# Patient Record
Sex: Male | Born: 1945
Health system: Southern US, Community
[De-identification: ages and names within clinical notes are randomized; demographics above are authoritative.]

## PROBLEM LIST (undated history)

## (undated) DIAGNOSIS — C61 Malignant neoplasm of prostate: Secondary | ICD-10-CM

## (undated) HISTORY — PX: PROSTATE BIOPSY: SHX241

## (undated) HISTORY — PX: WRIST FRACTURE SURGERY: SHX121

## (undated) HISTORY — DX: Malignant neoplasm of prostate: C61

## (undated) HISTORY — PX: INNER EAR SURGERY: SHX679

## (undated) HISTORY — PX: HEMORROIDECTOMY: SUR656

---

## 2013-05-21 ENCOUNTER — Encounter (HOSPITAL_COMMUNITY): Payer: Self-pay | Admitting: Emergency Medicine

## 2013-05-21 ENCOUNTER — Emergency Department (HOSPITAL_COMMUNITY)
Admission: EM | Admit: 2013-05-21 | Discharge: 2013-05-21 | Disposition: A | Discharge status: Home / Self Care | Payer: Medicare HMO | Attending: Emergency Medicine | Admitting: Emergency Medicine

## 2013-05-21 DIAGNOSIS — H9209 Otalgia, unspecified ear: Secondary | ICD-10-CM | POA: Insufficient documentation

## 2013-05-21 DIAGNOSIS — Z87891 Personal history of nicotine dependence: Secondary | ICD-10-CM | POA: Insufficient documentation

## 2013-05-21 DIAGNOSIS — J029 Acute pharyngitis, unspecified: Secondary | ICD-10-CM

## 2013-05-21 DIAGNOSIS — Z9889 Other specified postprocedural states: Secondary | ICD-10-CM | POA: Insufficient documentation

## 2013-05-21 MED ORDER — HYDROCODONE-ACETAMINOPHEN 5-325 MG PO TABS: 1.0000 | ORAL_TABLET | ORAL | Status: DC | PRN

## 2013-05-21 MED ORDER — HYDROCODONE-ACETAMINOPHEN 5-325 MG PO TABS
1.0000 | ORAL_TABLET | Freq: Once | ORAL | Status: AC
  Administered 2013-05-21: 1 via ORAL
  Filled 2013-05-21: qty 1

## 2013-05-21 MED ORDER — AMOXICILLIN 500 MG PO CAPS: 500.0000 mg | ORAL_CAPSULE | Freq: Three times a day (TID) | ORAL | Status: DC

## 2013-05-21 MED ORDER — PENICILLIN V POTASSIUM 250 MG PO TABS
500.0000 mg | ORAL_TABLET | Freq: Once | ORAL | Status: AC
  Administered 2013-05-21: 500 mg via ORAL
  Filled 2013-05-21: qty 2

## 2013-05-21 MED ORDER — IBUPROFEN 400 MG PO TABS
400.0000 mg | ORAL_TABLET | Freq: Once | ORAL | Status: AC
  Administered 2013-05-21: 400 mg via ORAL
  Filled 2013-05-21: qty 1

## 2013-05-21 NOTE — ED Notes (Signed)
Patient c/o left ear pain and sore throat x3 days. Denies any drainage. Per patient had operation on left ear in past.

## 2013-05-21 NOTE — ED Provider Notes (Signed)
CSN: [id number]     Arrival date & time 05/21/13  1842 History   First MD Initiated Contact with Patient 05/21/13 1942     Chief Complaint  Patient presents with  . Otalgia  . Sore Throat     (Consider location/radiation/quality/duration/timing/severity/associated sxs/prior Treatment) Patient is a 68 y.o. male presenting with pharyngitis. The history is provided by the patient.  Sore Throat This is a new problem. The current episode started in the past 7 days. The problem occurs constantly. The problem has been gradually worsening. Associated symptoms include arthralgias, fatigue, myalgias and a sore throat. Pertinent negatives include no abdominal pain, chest pain, coughing, neck pain, rash or vomiting. Associated symptoms comments: Left ear pain. The symptoms are aggravated by swallowing. He has tried acetaminophen for the symptoms. The treatment provided no relief.    History reviewed. No pertinent past medical history. Past Surgical History  Procedure Laterality Date  . Inner ear surgery    . Hemorroidectomy    . Wrist fracture surgery Left    History reviewed. No pertinent family history. History  Substance Use Topics  . Smoking status: Former Smoker -- 0.03 packs/day for 3 years    Types: Cigarettes  . Smokeless tobacco: Never Used  . Alcohol Use: No    Review of Systems  Constitutional: Positive for fatigue. Negative for activity change.       All ROS Neg except as noted in HPI  HENT: Positive for ear pain and sore throat. Negative for nosebleeds.   Eyes: Negative for photophobia and discharge.  Respiratory: Negative for cough, shortness of breath and wheezing.   Cardiovascular: Negative for chest pain and palpitations.  Gastrointestinal: Negative for vomiting, abdominal pain and blood in stool.  Genitourinary: Negative for dysuria, frequency and hematuria.  Musculoskeletal: Positive for arthralgias and myalgias. Negative for back pain and neck pain.  Skin: Negative.   Negative for rash.  Neurological: Negative for dizziness, seizures and speech difficulty.  Psychiatric/Behavioral: Negative for hallucinations and confusion.      Allergies  Review of patient's allergies indicates no known allergies.  Home Medications   Prior to Admission medications   Not on File   BP 178/94  Pulse 95  Temp(Src) 98.3 F (36.8 C) (Oral)  Resp 22  Ht 5\' 7"  (1.702 m)  Wt 178 lb (80.74 kg)  BMI 27.87 kg/m2  SpO2 99% Physical Exam  Nursing note and vitals reviewed. Constitutional: He is oriented to person, place, and time. He appears well-developed and well-nourished.  Non-toxic appearance.  HENT:  Head: Normocephalic.  Right Ear: Tympanic membrane and external ear normal.  Left Ear: Tympanic membrane and external ear normal.  Mouth/Throat: Posterior oropharyngeal erythema present.  Eyes: EOM and lids are normal. Pupils are equal, round, and reactive to light.  Neck: Normal range of motion. Neck supple. Carotid bruit is not present.  Cardiovascular: Normal rate, regular rhythm, normal heart sounds, intact distal pulses and normal pulses.   Pulmonary/Chest: Breath sounds normal. No respiratory distress.  Abdominal: Soft. Bowel sounds are normal. There is no tenderness. There is no guarding.  Musculoskeletal: Normal range of motion.  Lymphadenopathy:       Head (right side): No submandibular adenopathy present.       Head (left side): No submandibular adenopathy present.    He has no cervical adenopathy.  Neurological: He is alert and oriented to person, place, and time. He has normal strength. No cranial nerve deficit or sensory deficit.  Skin: Skin is warm and dry.  Psychiatric: He has a normal mood and affect. His speech is normal.    ED Course  Procedures (including critical care time) Labs Review Labs Reviewed - No data to display  Imaging Review No results found.   EKG Interpretation None      MDM Patient presents to the emergency  department with 3 days of sore throat and left ear pain. There is increased redness involving the posterior pharynx. There is pain with swallowing, or body aches is present also. The patient will be treated with Amoxil 3 times daily, and Norco every 6 hours for pain. The patient is invited to use salt water gargles. He is also invited to return to the emergency department if any changes, problems, or concerns.    Final diagnoses:  None    **I have reviewed nursing notes, vital signs, and all appropriate lab and imaging results for this patient.Lenox Ahr, PA-C 05/22/13 2141

## 2013-05-21 NOTE — Discharge Instructions (Signed)
Pharyngitis °Pharyngitis is redness, pain, and swelling (inflammation) of your pharynx.  °CAUSES  °Pharyngitis is usually caused by infection. Most of the time, these infections are from viruses (viral) and are part of a cold. However, sometimes pharyngitis is caused by bacteria (bacterial). Pharyngitis can also be caused by allergies. Viral pharyngitis may be spread from person to person by coughing, sneezing, and personal items or utensils (cups, forks, spoons, toothbrushes). Bacterial pharyngitis may be spread from person to person by more intimate contact, such as kissing.  °SIGNS AND SYMPTOMS  °Symptoms of pharyngitis include:   °· Sore throat.   °· Tiredness (fatigue).   °· Low-grade fever.   °· Headache. °· Joint pain and muscle aches. °· Skin rashes. °· Swollen lymph nodes. °· Plaque-like film on throat or tonsils (often seen with bacterial pharyngitis). °DIAGNOSIS  °Your health care provider will ask you questions about your illness and your symptoms. Your medical history, along with a physical exam, is often all that is needed to diagnose pharyngitis. Sometimes, a rapid strep test is done. Other lab tests may also be done, depending on the suspected cause.  °TREATMENT  °Viral pharyngitis will usually get better in 3 4 days without the use of medicine. Bacterial pharyngitis is treated with medicines that kill germs (antibiotics).  °HOME CARE INSTRUCTIONS  °· Drink enough water and fluids to keep your urine clear or pale yellow.   °· Only take over-the-counter or prescription medicines as directed by your health care provider:   °· If you are prescribed antibiotics, make sure you finish them even if you start to feel better.   °· Do not take aspirin.   °· Get lots of rest.   °· Gargle with 8 oz of salt water (½ tsp of salt per 1 qt of water) as often as every 1 2 hours to soothe your throat.   °· Throat lozenges (if you are not at risk for choking) or sprays may be used to soothe your throat. °SEEK MEDICAL  CARE IF:  °· You have large, tender lumps in your neck. °· You have a rash. °· You cough up green, yellow-brown, or bloody spit. °SEEK IMMEDIATE MEDICAL CARE IF:  °· Your neck becomes stiff. °· You drool or are unable to swallow liquids. °· You vomit or are unable to keep medicines or liquids down. °· You have severe pain that does not go away with the use of recommended medicines. °· You have trouble breathing (not caused by a stuffy nose). °MAKE SURE YOU:  °· Understand these instructions. °· Will watch your condition. °· Will get help right away if you are not doing well or get worse. °Document Released: 01/14/2005 Document Revised: 11/04/2012 Document Reviewed: 09/21/2012 °[employer]® Patient Information ©2014 [employer]. ° °

## 2013-05-26 NOTE — ED Provider Notes (Signed)
Medical screening examination/treatment/procedure(s) were performed by non-physician practitioner and as supervising physician I was immediately available for consultation/collaboration.   EKG Interpretation None      Rolland Porter, MD, Abram Sander   Janice Norrie, MD 05/26/13 [phone]

## 2013-07-19 ENCOUNTER — Telehealth: Payer: Self-pay | Admitting: Family Medicine

## 2013-07-19 NOTE — Telephone Encounter (Signed)
Pt aware that we are currently not taking new chronic patients- they were advised to call back the first of July- that we have some new MD's coming.

## 2013-07-27 ENCOUNTER — Emergency Department (HOSPITAL_COMMUNITY)
Admission: EM | Admit: 2013-07-27 | Discharge: 2013-07-27 | Disposition: A | Discharge status: Home / Self Care | Payer: Medicare HMO | Attending: Emergency Medicine | Admitting: Emergency Medicine

## 2013-07-27 ENCOUNTER — Encounter (HOSPITAL_COMMUNITY): Payer: Self-pay | Admitting: Emergency Medicine

## 2013-07-27 DIAGNOSIS — R51 Headache: Secondary | ICD-10-CM | POA: Insufficient documentation

## 2013-07-27 DIAGNOSIS — Z792 Long term (current) use of antibiotics: Secondary | ICD-10-CM | POA: Insufficient documentation

## 2013-07-27 DIAGNOSIS — H9319 Tinnitus, unspecified ear: Secondary | ICD-10-CM | POA: Insufficient documentation

## 2013-07-27 DIAGNOSIS — R42 Dizziness and giddiness: Secondary | ICD-10-CM | POA: Insufficient documentation

## 2013-07-27 DIAGNOSIS — Z87891 Personal history of nicotine dependence: Secondary | ICD-10-CM | POA: Insufficient documentation

## 2013-07-27 DIAGNOSIS — Z9889 Other specified postprocedural states: Secondary | ICD-10-CM | POA: Insufficient documentation

## 2013-07-27 NOTE — ED Notes (Signed)
Pt c/o of dizziness over last 2 months, progressively worsening. Hx of leg ear surgery around 20 years ago, unsure of type. Admits to intermittent ringing in left ear.

## 2013-07-27 NOTE — Discharge Instructions (Signed)

## 2013-07-27 NOTE — ED Provider Notes (Signed)
CSN: [id number]     Arrival date & time 07/27/13  1497 History  This chart was scribed for Sharyon Cable, MD by Cathie Hoops, ED Scribe. The patient was seen in [place]. The patient's care was started at 9:44 AM.   Chief Complaint  Patient presents with  . Dizziness   Patient is a 68 y.o. male presenting with dizziness. The history is provided by the patient. No language interpreter was used.  Dizziness Quality:  Room spinning Severity:  Moderate Onset quality:  Gradual Duration:  2 months Timing:  Constant Progression:  Waxing and waning Chronicity:  Recurrent Relieved by:  Nothing Worsened by:  Nothing tried Associated symptoms: headaches and tinnitus (left ear)   Associated symptoms: no chest pain, no shortness of breath, no vomiting and no weakness   Risk factors: no hx of stroke    HPI Comments: Frankey Botting is a 68 y.o. male who presents to the Emergency Department complaining of constant, waxing and waning dizziness for 2 months. Pt states he experiences a room spinning sensation. Pt states he is currently not in pain and is feeling better today. He states he experiences associated intermittent tinnitus in his left ear, left ear pain, and headache. Pt last saw the ED last week and has gone to the ED about 4-5 times in 2 months. Pt states he had previous surgery in the left ear about 10 years ago.  He denies vision changes, fever, vomiting, SOB, chest pain, difficulty speaking, weakness in the arms or the legs, numbness, slurred speech. Pt denies any history of CVA.  He reports he has not had any recent imaging  PMH - none Past Surgical History  Procedure Laterality Date  . Inner ear surgery    . Hemorroidectomy    . Wrist fracture surgery Left    History reviewed. No pertinent family history. History  Substance Use Topics  . Smoking status: Former Smoker -- 0.03 packs/day for 3 years    Types: Cigarettes  . Smokeless tobacco: Never Used  . Alcohol Use: No     Review of Systems  Constitutional: Negative for fever and chills.  HENT: Positive for ear pain (left ) and tinnitus (left ear).   Eyes: Negative for visual disturbance.  Respiratory: Negative for shortness of breath.   Cardiovascular: Negative for chest pain.  Gastrointestinal: Negative for vomiting.  Neurological: Positive for dizziness and headaches. Negative for speech difficulty, weakness and numbness.  All other systems reviewed and are negative.   Allergies  Review of patient's allergies indicates no known allergies.  Home Medications   Prior to Admission medications   Medication Sig Start Date End Date Taking? Authorizing Provider  amoxicillin (AMOXIL) 500 MG capsule Take 1 capsule (500 mg total) by mouth 3 (three) times daily. 05/21/13   Lenox Ahr, PA-C  HYDROcodone-acetaminophen (NORCO/VICODIN) 5-325 MG per tablet Take 1 tablet by mouth every 4 (four) hours as needed for moderate pain. 05/21/13   Lenox Ahr, PA-C   Triage Vitals: Pulse 83  Ht 5\' 7"  (1.702 m)  Wt 165 lb (74.844 kg)  BMI 25.84 kg/m2  SpO2 98%  Physical Exam  Nursing note and vitals reviewed.  CONSTITUTIONAL: Well developed/well nourished HEAD: Normocephalic/atraumatic EYES: EOMI/PERRL, no nystagmus,no ptosis ENMT: Mucous membranes moist, bilateral TMs intact.  Left TM mild erythema. NECK: supple no meningeal signs, no bruits CV: S1/S2 noted, no murmurs/rubs/gallops noted LUNGS: Lungs are clear to auscultation bilaterally, no apparent distress ABDOMEN: soft, nontender, no rebound or guarding GU:no  cva tenderness NEURO:Awake/alert, facies symmetric, no arm or leg drift is noted Equal 5/5 strength with hip flexion,knee flex/extension, foot dorsi/plantar flexion Cranial nerves 3/4/5/6/08/05/08/11/12 tested and intact Gait normal without ataxia No past pointing EXTREMITIES: pulses normal, full ROM SKIN: warm, color normal PSYCH: no abnormalities of mood noted   ED Course   Procedures DIAGNOSTIC STUDIES: Oxygen Saturation is 98% on RA, normal by my interpretation.    COORDINATION OF CARE: 9:48 AM- Discussed options for a referral to ENT. Patient informed of current plan for treatment and evaluation and agrees with plan at this time.  Pt reports he is actually feeling improved He has no focal neuro deficits Low suspicion for CVA He reports he would like specialist referral We discussed strict return precautions  MDM   Final diagnoses:  Dizziness    Nursing notes including past medical history and social history reviewed and considered in documentation   I personally performed the services described in this documentation, which was scribed in my presence. The recorded information has been reviewed and is accurate.      Sharyon Cable, MD 07/27/13 1040

## 2015-08-19 ENCOUNTER — Emergency Department (HOSPITAL_COMMUNITY)
Admission: EM | Admit: 2015-08-19 | Discharge: 2015-08-19 | Disposition: A | Discharge status: Home / Self Care | Payer: No Typology Code available for payment source | Attending: Emergency Medicine | Admitting: Emergency Medicine

## 2015-08-19 ENCOUNTER — Encounter (HOSPITAL_COMMUNITY): Payer: Self-pay | Admitting: Emergency Medicine

## 2015-08-19 ENCOUNTER — Emergency Department (HOSPITAL_COMMUNITY): Payer: No Typology Code available for payment source

## 2015-08-19 DIAGNOSIS — A0100 Typhoid fever, unspecified: Secondary | ICD-10-CM | POA: Diagnosis not present

## 2015-08-19 DIAGNOSIS — Z87891 Personal history of nicotine dependence: Secondary | ICD-10-CM | POA: Insufficient documentation

## 2015-08-19 DIAGNOSIS — Y92410 Unspecified street and highway as the place of occurrence of the external cause: Secondary | ICD-10-CM | POA: Diagnosis not present

## 2015-08-19 DIAGNOSIS — Y999 Unspecified external cause status: Secondary | ICD-10-CM | POA: Insufficient documentation

## 2015-08-19 DIAGNOSIS — S161XXA Strain of muscle, fascia and tendon at neck level, initial encounter: Secondary | ICD-10-CM | POA: Diagnosis not present

## 2015-08-19 DIAGNOSIS — Y939 Activity, unspecified: Secondary | ICD-10-CM | POA: Diagnosis not present

## 2015-08-19 DIAGNOSIS — M542 Cervicalgia: Secondary | ICD-10-CM | POA: Diagnosis not present

## 2015-08-19 MED ORDER — IBUPROFEN 600 MG PO TABS: 600.0000 mg | ORAL_TABLET | Freq: Four times a day (QID) | ORAL | Status: DC | PRN

## 2015-08-19 MED ORDER — ACETAMINOPHEN 325 MG PO TABS
650.0000 mg | ORAL_TABLET | Freq: Once | ORAL | Status: AC
  Administered 2015-08-19: 650 mg via ORAL
  Filled 2015-08-19: qty 2

## 2015-08-19 NOTE — ED Notes (Signed)
Pt made aware to return if symptoms worsen or if any life threatening symptoms occur.   

## 2015-08-19 NOTE — Discharge Instructions (Signed)
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
## 2015-09-18 ENCOUNTER — Telehealth: Payer: Self-pay | Admitting: Orthopaedic Surgery
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
## 2015-09-20 ENCOUNTER — Encounter: Payer: Self-pay | Admitting: Orthopaedic Surgery

## 2015-09-20 ENCOUNTER — Ambulatory Visit (INDEPENDENT_AMBULATORY_CARE_PROVIDER_SITE_OTHER): Payer: PPO | Admitting: Orthopaedic Surgery

## 2015-09-20 VITALS — BP 138/76 | HR 71 | Ht 67.0 in | Wt 155.0 lb

## 2015-09-20 DIAGNOSIS — M542 Cervicalgia: Secondary | ICD-10-CM | POA: Diagnosis not present

## 2015-09-20 MED ORDER — NAPROXEN 500 MG PO TABS: 500.0000 mg | ORAL_TABLET | Freq: Two times a day (BID) | ORAL | 5 refills | Status: DC
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
## 2015-09-27 ENCOUNTER — Ambulatory Visit (HOSPITAL_COMMUNITY): Payer: PPO | Attending: Orthopaedic Surgery | Admitting: Physical Therapy

## 2015-09-27 DIAGNOSIS — R29898 Other symptoms and signs involving the musculoskeletal system: Secondary | ICD-10-CM | POA: Insufficient documentation

## 2015-09-27 DIAGNOSIS — M542 Cervicalgia: Secondary | ICD-10-CM | POA: Diagnosis not present
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
## 2015-09-28 ENCOUNTER — Encounter: Payer: Self-pay | Admitting: Orthopaedic Surgery

## 2015-10-04 ENCOUNTER — Ambulatory Visit: Payer: PPO | Admitting: Orthopaedic Surgery

## 2015-10-04 ENCOUNTER — Ambulatory Visit (HOSPITAL_COMMUNITY): Payer: No Typology Code available for payment source | Attending: Orthopaedic Surgery | Admitting: Physical Therapy

## 2015-10-04 DIAGNOSIS — R29898 Other symptoms and signs involving the musculoskeletal system: Secondary | ICD-10-CM

## 2015-10-04 DIAGNOSIS — M542 Cervicalgia: Secondary | ICD-10-CM | POA: Insufficient documentation
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
## 2015-10-04 NOTE — Patient Instructions (Signed)
Isometric Lateral Flexion    Put right index finger on right temple. Gently try to move right ear toward shoulder, pushing against finger. Hold __5__ seconds. Repeat on other side. Push and release slowly. Repeat __10__ times. Do _2___ sessions per day.     Isometric Extension    Put index fingers gently on back of head. Slowly try to look toward ceiling. Push head into fingers for _5___ seconds. Push and release slowly. Repeat __10__ times. Do _2___ sessions per day.

## 2015-10-09 ENCOUNTER — Ambulatory Visit (HOSPITAL_COMMUNITY): Payer: No Typology Code available for payment source

## 2015-10-09 DIAGNOSIS — M542 Cervicalgia: Secondary | ICD-10-CM

## 2015-10-09 DIAGNOSIS — R29898 Other symptoms and signs involving the musculoskeletal system: Secondary | ICD-10-CM
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
## 2015-10-11 ENCOUNTER — Ambulatory Visit (INDEPENDENT_AMBULATORY_CARE_PROVIDER_SITE_OTHER): Payer: PPO | Admitting: Orthopaedic Surgery

## 2015-10-11 ENCOUNTER — Encounter: Payer: Self-pay | Admitting: Orthopaedic Surgery

## 2015-10-11 ENCOUNTER — Ambulatory Visit (HOSPITAL_COMMUNITY): Payer: No Typology Code available for payment source

## 2015-10-11 VITALS — BP 118/68 | HR 78 | Temp 97.5°F | Ht 66.5 in | Wt 154.0 lb

## 2015-10-11 DIAGNOSIS — M542 Cervicalgia: Secondary | ICD-10-CM

## 2015-10-11 DIAGNOSIS — R29898 Other symptoms and signs involving the musculoskeletal system: Secondary | ICD-10-CM
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
## 2015-10-11 NOTE — Progress Notes (Signed)
Patient Casey Waters, male DOB:08-28-1945, 70 y.o. [id number]  Chief Complaint  Patient presents with  . Follow-up    neck pain    HPI  Casey Waters is a 70 y.o. male who has neck pain.  He has been to PT and is significantly improved.  He has less pain.  He has no paresthesias.  He is doing his exercises at home.  He has no shoulder pain. HPI  Body mass index is 24.48 kg/m.  ROS  Review of Systems  HENT: Negative for congestion.   Respiratory: Negative for cough and shortness of breath.   Cardiovascular: Negative for chest pain and leg swelling.  Endocrine: Negative for cold intolerance.  Musculoskeletal: Positive for arthralgias, myalgias and neck pain.  Allergic/Immunologic: Negative for environmental allergies.    No past medical history on file.  Past Surgical History:  Procedure Laterality Date  . HEMORROIDECTOMY    . INNER EAR SURGERY    . WRIST FRACTURE SURGERY Left     Family History  Problem Relation Age of Onset  . Cancer Mother   . Cancer Sister   . Cancer Brother     Social History Social History  Substance Use Topics  . Smoking status: Former Smoker    Packs/day: 0.03    Years: 3.00    Types: Cigarettes  . Smokeless tobacco: Never Used  . Alcohol use No    No Known Allergies  Current Outpatient Prescriptions  Medication Sig Dispense Refill  . naproxen (NAPROSYN) 500 MG tablet Take 1 tablet (500 mg total) by mouth 2 (two) times daily with a meal. 60 tablet 5   No current facility-administered medications for this visit.      Physical Exam  Blood pressure 118/68, pulse 78, temperature 97.5 F (36.4 C), height 5' 6.5" (1.689 m), weight 154 lb (69.9 kg).  Constitutional: overall normal hygiene, normal nutrition, well developed, normal grooming, normal body habitus. Assistive device:none  Musculoskeletal: gait and station Limp none, muscle tone and strength are normal, no tremors or atrophy is present.  .  Neurological:  coordination overall normal.  Deep tendon reflex/nerve stretch intact.  Sensation normal.  Cranial nerves II-XII intact.   Skin:   Normal overall no scars, lesions, ulcers or rashes. No psoriasis.  Psychiatric: Alert and oriented x 3.  Recent memory intact, remote memory unclear.  Normal mood and affect. Well groomed.  Good eye contact.  Cardiovascular: overall no swelling, no varicosities, no edema bilaterally, normal temperatures of the legs and arms, no clubbing, cyanosis and good capillary refill.  Lymphatic: palpation is normal.  He has full ROM of his neck and no pain.  He has full ROM of the shoulders and no pain.  He has normal grips and normal NV status.  The patient has been educated about the nature of the problem(s) and counseled on treatment options.  The patient appeared to understand what I have discussed and is in agreement with it.  Encounter Diagnosis  Name Primary?  . Neck pain, acute Yes    PLAN Call if any problems.  Precautions discussed.  Continue current medications.   Return to clinic 1 month   Electronically Signed Sanjuana Kava, MD 9/13/20173:13 PM

## 2015-10-16 ENCOUNTER — Ambulatory Visit (HOSPITAL_COMMUNITY): Payer: No Typology Code available for payment source

## 2015-10-16 DIAGNOSIS — M542 Cervicalgia: Secondary | ICD-10-CM

## 2015-10-16 DIAGNOSIS — R29898 Other symptoms and signs involving the musculoskeletal system: Secondary | ICD-10-CM
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
## 2015-10-18 ENCOUNTER — Ambulatory Visit (HOSPITAL_COMMUNITY): Payer: No Typology Code available for payment source | Admitting: Physical Therapy

## 2015-10-18 DIAGNOSIS — R29898 Other symptoms and signs involving the musculoskeletal system: Secondary | ICD-10-CM

## 2015-10-18 DIAGNOSIS — M542 Cervicalgia: Secondary | ICD-10-CM | POA: Diagnosis not present
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
## 2015-10-23 ENCOUNTER — Ambulatory Visit (HOSPITAL_COMMUNITY): Payer: No Typology Code available for payment source

## 2015-10-23 DIAGNOSIS — R29898 Other symptoms and signs involving the musculoskeletal system: Secondary | ICD-10-CM

## 2015-10-23 DIAGNOSIS — M542 Cervicalgia: Secondary | ICD-10-CM | POA: Diagnosis not present
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
## 2015-10-25 ENCOUNTER — Ambulatory Visit (HOSPITAL_COMMUNITY): Payer: No Typology Code available for payment source | Admitting: Physical Therapy

## 2015-10-25 DIAGNOSIS — M542 Cervicalgia: Secondary | ICD-10-CM

## 2015-10-25 DIAGNOSIS — R29898 Other symptoms and signs involving the musculoskeletal system: Secondary | ICD-10-CM
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
## 2015-11-08 ENCOUNTER — Ambulatory Visit (INDEPENDENT_AMBULATORY_CARE_PROVIDER_SITE_OTHER): Payer: PPO | Admitting: Orthopaedic Surgery

## 2015-11-08 ENCOUNTER — Encounter: Payer: Self-pay | Admitting: Orthopaedic Surgery

## 2015-11-08 VITALS — BP 115/71 | HR 79 | Temp 97.5°F | Ht 66.5 in | Wt 152.0 lb

## 2015-11-08 DIAGNOSIS — M542 Cervicalgia: Secondary | ICD-10-CM

## 2015-11-08 NOTE — Progress Notes (Signed)
Patient Casey Waters:[id number] ASHAWN ORTEGON, male DOB:[birth year], 70 y.o. CK:[id number]  Chief Complaint  Patient presents with  . Follow-up    neck pain    HPI  QUANTARIUS SWIDER is a 70 y.o. male who has had neck pain.  He has been to PT and his pain is now resolved.  He has no problem.  He has full ROM of his neck and shoulders.  He has no paresthesias.  He is very pleased. HPI  Body mass index is 24.17 kg/m.  ROS  Review of Systems  HENT: Negative for congestion.   Respiratory: Negative for cough and shortness of breath.   Cardiovascular: Negative for chest pain and leg swelling.  Endocrine: Negative for cold intolerance.  Musculoskeletal: Positive for arthralgias, myalgias and neck pain.  Allergic/Immunologic: Negative for environmental allergies.    No past medical history on file.  Past Surgical History:  Procedure Laterality Date  . HEMORROIDECTOMY    . INNER EAR SURGERY    . WRIST FRACTURE SURGERY Left     Family History  Problem Relation Age of Onset  . Cancer Mother   . Cancer Sister   . Cancer Brother     Social History Social History  Substance Use Topics  . Smoking status: Former Smoker    Packs/day: 0.03    Years: 3.00    Types: Cigarettes  . Smokeless tobacco: Never Used  . Alcohol use No    No Known Allergies  No current outpatient prescriptions on file.   No current facility-administered medications for this visit.      Physical Exam  Blood pressure 115/71, pulse 79, temperature 97.5 F (36.4 C), height 5' 6.5" (1.689 m), weight 152 lb (68.9 kg).  Constitutional: overall normal hygiene, normal nutrition, well developed, normal grooming, normal body habitus. Assistive device:none  Musculoskeletal: gait and station Limp none, muscle tone and strength are normal, no tremors or atrophy is present.  .  Neurological: coordination overall normal.  Deep tendon reflex/nerve stretch intact.  Sensation normal.  Cranial nerves II-XII intact.   Skin:    Normal overall no scars, lesions, ulcers or rashes. No psoriasis.  Psychiatric: Alert and oriented x 3.  Recent memory intact, remote memory unclear.  Normal mood and affect. Well groomed.  Good eye contact.  Cardiovascular: overall no swelling, no varicosities, no edema bilaterally, normal temperatures of the legs and arms, no clubbing, cyanosis and good capillary refill.  Lymphatic: palpation is normal.  He has full ROM of the neck and both shoulders.  NV intact.  Grips are normal.  The patient has been educated about the nature of the problem(s) and counseled on treatment options.  The patient appeared to understand what I have discussed and is in agreement with it.  Encounter Diagnosis  Name Primary?  . Neck pain, acute Yes    PLAN Call if any problems.  Precautions discussed.  Continue current medications.   Return to clinic discharged   Electronically Signed Sanjuana Kava, MD 10/11/20173:20 PM

## 2017-05-04 DIAGNOSIS — Z87891 Personal history of nicotine dependence: Secondary | ICD-10-CM | POA: Diagnosis not present

## 2017-05-04 DIAGNOSIS — J189 Pneumonia, unspecified organism: Secondary | ICD-10-CM | POA: Diagnosis not present

## 2017-05-04 DIAGNOSIS — J111 Influenza due to unidentified influenza virus with other respiratory manifestations: Secondary | ICD-10-CM | POA: Diagnosis not present

## 2017-05-04 DIAGNOSIS — J181 Lobar pneumonia, unspecified organism: Secondary | ICD-10-CM | POA: Diagnosis not present

## 2017-05-04 DIAGNOSIS — R51 Headache: Secondary | ICD-10-CM | POA: Diagnosis not present

## 2018-08-18 DIAGNOSIS — H672 Otitis media in diseases classified elsewhere, left ear: Secondary | ICD-10-CM | POA: Diagnosis not present

## 2018-08-18 DIAGNOSIS — M7918 Myalgia, other site: Secondary | ICD-10-CM | POA: Diagnosis not present

## 2018-08-18 DIAGNOSIS — Z125 Encounter for screening for malignant neoplasm of prostate: Secondary | ICD-10-CM | POA: Diagnosis not present

## 2018-08-18 DIAGNOSIS — I10 Essential (primary) hypertension: Secondary | ICD-10-CM | POA: Diagnosis not present

## 2018-08-18 DIAGNOSIS — Z6825 Body mass index (BMI) 25.0-25.9, adult: Secondary | ICD-10-CM | POA: Diagnosis not present

## 2018-08-26 DIAGNOSIS — Z125 Encounter for screening for malignant neoplasm of prostate: Secondary | ICD-10-CM | POA: Diagnosis not present

## 2018-09-28 DIAGNOSIS — R972 Elevated prostate specific antigen [PSA]: Secondary | ICD-10-CM | POA: Diagnosis not present

## 2018-09-28 DIAGNOSIS — N402 Nodular prostate without lower urinary tract symptoms: Secondary | ICD-10-CM | POA: Diagnosis not present

## 2018-11-05 DIAGNOSIS — C61 Malignant neoplasm of prostate: Secondary | ICD-10-CM | POA: Diagnosis not present

## 2018-11-05 DIAGNOSIS — R972 Elevated prostate specific antigen [PSA]: Secondary | ICD-10-CM | POA: Diagnosis not present

## 2018-11-18 DIAGNOSIS — M7918 Myalgia, other site: Secondary | ICD-10-CM | POA: Diagnosis not present

## 2018-11-18 DIAGNOSIS — Z0000 Encounter for general adult medical examination without abnormal findings: Secondary | ICD-10-CM | POA: Diagnosis not present

## 2018-11-18 DIAGNOSIS — Z1389 Encounter for screening for other disorder: Secondary | ICD-10-CM | POA: Diagnosis not present

## 2018-11-18 DIAGNOSIS — Z6825 Body mass index (BMI) 25.0-25.9, adult: Secondary | ICD-10-CM | POA: Diagnosis not present

## 2018-11-18 DIAGNOSIS — I10 Essential (primary) hypertension: Secondary | ICD-10-CM | POA: Diagnosis not present

## 2018-11-20 DIAGNOSIS — C61 Malignant neoplasm of prostate: Secondary | ICD-10-CM | POA: Diagnosis not present

## 2018-11-25 ENCOUNTER — Other Ambulatory Visit: Payer: Self-pay | Admitting: *Deleted

## 2018-11-25 DIAGNOSIS — Z20822 Contact with and (suspected) exposure to covid-19: Secondary | ICD-10-CM

## 2018-11-26 LAB — NOVEL CORONAVIRUS, NAA: SARS-CoV-2, NAA: DETECTED — AB

## 2018-12-29 DIAGNOSIS — Z1211 Encounter for screening for malignant neoplasm of colon: Secondary | ICD-10-CM | POA: Diagnosis not present

## 2019-01-04 DIAGNOSIS — B029 Zoster without complications: Secondary | ICD-10-CM | POA: Diagnosis not present

## 2019-01-04 DIAGNOSIS — Z6825 Body mass index (BMI) 25.0-25.9, adult: Secondary | ICD-10-CM | POA: Diagnosis not present

## 2019-01-20 DIAGNOSIS — B029 Zoster without complications: Secondary | ICD-10-CM | POA: Diagnosis not present

## 2019-01-20 DIAGNOSIS — Z6825 Body mass index (BMI) 25.0-25.9, adult: Secondary | ICD-10-CM | POA: Diagnosis not present

## 2019-01-20 DIAGNOSIS — Z0000 Encounter for general adult medical examination without abnormal findings: Secondary | ICD-10-CM | POA: Diagnosis not present

## 2019-05-04 DIAGNOSIS — M7918 Myalgia, other site: Secondary | ICD-10-CM | POA: Diagnosis not present

## 2019-05-04 DIAGNOSIS — Z6827 Body mass index (BMI) 27.0-27.9, adult: Secondary | ICD-10-CM | POA: Diagnosis not present

## 2019-05-04 DIAGNOSIS — Z125 Encounter for screening for malignant neoplasm of prostate: Secondary | ICD-10-CM | POA: Diagnosis not present

## 2019-05-04 DIAGNOSIS — G4700 Insomnia, unspecified: Secondary | ICD-10-CM | POA: Diagnosis not present

## 2019-05-04 DIAGNOSIS — B029 Zoster without complications: Secondary | ICD-10-CM | POA: Diagnosis not present

## 2019-05-04 DIAGNOSIS — I10 Essential (primary) hypertension: Secondary | ICD-10-CM | POA: Diagnosis not present

## 2019-05-04 DIAGNOSIS — Z0000 Encounter for general adult medical examination without abnormal findings: Secondary | ICD-10-CM | POA: Diagnosis not present

## 2019-05-04 DIAGNOSIS — G5690 Unspecified mononeuropathy of unspecified upper limb: Secondary | ICD-10-CM | POA: Diagnosis not present

## 2019-08-09 DIAGNOSIS — Z6827 Body mass index (BMI) 27.0-27.9, adult: Secondary | ICD-10-CM | POA: Diagnosis not present

## 2019-08-09 DIAGNOSIS — M7918 Myalgia, other site: Secondary | ICD-10-CM | POA: Diagnosis not present

## 2019-08-09 DIAGNOSIS — I10 Essential (primary) hypertension: Secondary | ICD-10-CM | POA: Diagnosis not present

## 2019-08-09 DIAGNOSIS — Z125 Encounter for screening for malignant neoplasm of prostate: Secondary | ICD-10-CM | POA: Diagnosis not present

## 2019-08-09 DIAGNOSIS — G5690 Unspecified mononeuropathy of unspecified upper limb: Secondary | ICD-10-CM | POA: Diagnosis not present

## 2019-08-09 DIAGNOSIS — G4700 Insomnia, unspecified: Secondary | ICD-10-CM | POA: Diagnosis not present

## 2019-11-10 DIAGNOSIS — G4700 Insomnia, unspecified: Secondary | ICD-10-CM | POA: Diagnosis not present

## 2019-11-10 DIAGNOSIS — I10 Essential (primary) hypertension: Secondary | ICD-10-CM | POA: Diagnosis not present

## 2019-11-10 DIAGNOSIS — H6242 Otitis externa in other diseases classified elsewhere, left ear: Secondary | ICD-10-CM | POA: Diagnosis not present

## 2019-11-10 DIAGNOSIS — Z6825 Body mass index (BMI) 25.0-25.9, adult: Secondary | ICD-10-CM | POA: Diagnosis not present

## 2019-11-10 DIAGNOSIS — G5690 Unspecified mononeuropathy of unspecified upper limb: Secondary | ICD-10-CM | POA: Diagnosis not present

## 2020-02-10 DIAGNOSIS — G5690 Unspecified mononeuropathy of unspecified upper limb: Secondary | ICD-10-CM | POA: Diagnosis not present

## 2020-02-10 DIAGNOSIS — Z6826 Body mass index (BMI) 26.0-26.9, adult: Secondary | ICD-10-CM | POA: Diagnosis not present

## 2020-02-10 DIAGNOSIS — Z0000 Encounter for general adult medical examination without abnormal findings: Secondary | ICD-10-CM | POA: Diagnosis not present

## 2020-02-10 DIAGNOSIS — H6242 Otitis externa in other diseases classified elsewhere, left ear: Secondary | ICD-10-CM | POA: Diagnosis not present

## 2020-02-10 DIAGNOSIS — I10 Essential (primary) hypertension: Secondary | ICD-10-CM | POA: Diagnosis not present

## 2020-02-10 DIAGNOSIS — G4700 Insomnia, unspecified: Secondary | ICD-10-CM | POA: Diagnosis not present

## 2020-03-30 ENCOUNTER — Other Ambulatory Visit: Payer: Self-pay

## 2020-03-30 ENCOUNTER — Ambulatory Visit (INDEPENDENT_AMBULATORY_CARE_PROVIDER_SITE_OTHER): Payer: PPO | Admitting: Urology

## 2020-03-30 ENCOUNTER — Encounter: Payer: Self-pay | Admitting: Urology

## 2020-03-30 VITALS — BP 150/81 | HR 62 | Temp 98.4°F | Ht 67.5 in | Wt 164.0 lb

## 2020-03-30 DIAGNOSIS — N403 Nodular prostate with lower urinary tract symptoms: Secondary | ICD-10-CM | POA: Diagnosis not present

## 2020-03-30 DIAGNOSIS — C61 Malignant neoplasm of prostate: Secondary | ICD-10-CM | POA: Diagnosis not present

## 2020-03-30 DIAGNOSIS — R972 Elevated prostate specific antigen [PSA]: Secondary | ICD-10-CM | POA: Diagnosis not present

## 2020-03-30 LAB — URINALYSIS, ROUTINE W REFLEX MICROSCOPIC
Bilirubin, UA: NEGATIVE
Glucose, UA: NEGATIVE
Ketones, UA: NEGATIVE
Leukocytes,UA: NEGATIVE
Nitrite, UA: NEGATIVE
Protein,UA: NEGATIVE
RBC, UA: NEGATIVE
Specific Gravity, UA: 1.025 (ref 1.005–1.030)
Urobilinogen, Ur: 0.2 mg/dL (ref 0.2–1.0)
pH, UA: 7 (ref 5.0–7.5)

## 2020-03-30 NOTE — Progress Notes (Signed)
Subjective: 1. Prostate cancer ([place])   2. Elevated PSA   3. Nodular prostate with lower urinary tract symptoms      Consult requested by Stoney Bang MD.  Mr. Ledyard is a 75 yo male who is sent for the evaluation of an elevated PSA of 11.5 or recent labs.   He has a history of high risk Gleason 8 prostate cancer found a biopsy on 11/05/18 for a PSA of 8.7 on 08/18/18.  He had 4 right prostate cores with cancer, 3 with GG3 and 1 with GG4.  He had 2 left cores, 1 with GG3 and 1 with GG2.   He had a right sided nodule for T2a disease.  His prostate volume was 29ml.   He was seen following the biopsy and treatment options were discussed.  He was scheduled for staging but never followed up.   He thought the Lord had cured him so he never followed up.  He has minimal voiding complaints with nocturia x 1-2.  He has had no hematuria.  He has no pain or weight loss.  He uses sildenafil for ED with success.   ROS:  ROS  No Known Allergies  Past Medical History:  Diagnosis Date  . Prostate CA [facility])     Past Surgical History:  Procedure Laterality Date  . HEMORROIDECTOMY    . INNER EAR SURGERY    . WRIST FRACTURE SURGERY Left     Social History   Socioeconomic History  . Marital status: Married    Spouse name: Not on file  . Number of children: 2  . Years of education: Not on file  . Highest education level: Not on file  Occupational History  . Occupation: retired  Tobacco Use  . Smoking status: Former Smoker    Packs/day: 0.03    Years: 3.00    Pack years: 0.09    Types: Cigarettes  . Smokeless tobacco: Never Used  Substance and Sexual Activity  . Alcohol use: No  . Drug use: No  . Sexual activity: Not on file  Other Topics Concern  . Not on file  Social History Narrative  . Not on file   Social Determinants of Health   Financial Resource Strain: Not on file  Food Insecurity: Not on file  Transportation Needs: Not on file  Physical Activity: Not on file  Stress: Not  on file  Social Connections: Not on file  Intimate Partner Violence: Not on file    Family History  Problem Relation Age of Onset  . Cancer Mother   . Cancer Sister   . Cancer Brother     Anti-infectives: Anti-infectives (From admission, onward)   None      Current Outpatient Medications  Medication Sig Dispense Refill  . Multiple Vitamin (MULTIVITAMIN) tablet Take 1 tablet by mouth daily.    Marland Kitchen neomycin-polymyxin-hydrocortisone (CORTISPORIN) 3.5-10000-1 OTIC suspension 3 (three) times daily.    . sildenafil (REVATIO) 20 MG tablet Take 20 mg by mouth as directed.    . Thiamine HCl (VITAMIN B-1) 250 MG tablet Take 250 mg by mouth daily.     No current facility-administered medications for this visit.     Objective: Vital signs in last 24 hours: BP (!) 150/81   Pulse 62   Temp 98.4 F (36.9 C)   Ht 5' 7.5" (1.715 m)   Wt 164 lb (74.4 kg)   BMI 25.31 kg/m   Intake/Output from previous day: No intake/output data recorded. Intake/Output this shift: @IOTHISSHIFT @  Physical Exam Vitals reviewed.  Constitutional:      Appearance: Normal appearance.  Chest:  Breasts:     Right: No supraclavicular adenopathy.     Left: No supraclavicular adenopathy.    Abdominal:     General: Abdomen is flat.     Palpations: Abdomen is soft.     Hernia: No hernia is present.  Genitourinary:    Comments: Uncirc phallus with adequate meatus. Scrotum, testes and epididymis normal. AP without lesions. NST without mass. Prostate 2+ with right induration and nodularity with some induration of the left mid prostate. SV non-palpable.  Musculoskeletal:        General: No swelling or tenderness. Normal range of motion.  Lymphadenopathy:     Cervical: No cervical adenopathy.     Upper Body:     Right upper body: No supraclavicular adenopathy.     Left upper body: No supraclavicular adenopathy.     Lower Body: No right inguinal adenopathy. No left inguinal adenopathy.  Skin:     General: Skin is warm and dry.  Neurological:     General: No focal deficit present.     Mental Status: He is alert and oriented to person, place, and time.  Psychiatric:        Mood and Affect: Mood normal.        Behavior: Behavior normal.     Lab Results:  UA is clear.   Studies/Results: I have reviewed his records from [place] and his Path report.      Assessment/Plan: He has a rising PSA and worsening exam with a T2c Nx Mx Gleason 8, high risk prostate cancer that is life threatening  and needs staging with a bone scan and CT to r/o metastatic disease.  He will return with the results to initiate ADT and I will send him for a Radiation Oncology consultation if the metastatic w/u is negative.  I reviewed the side effects of ADT in detail.     No orders of the defined types were placed in this encounter.    Orders Placed This Encounter  Procedures  . CT ABDOMEN PELVIS W CONTRAST    Standing Status:   Future    Standing Expiration Date:   04/30/2020    Order Specific Question:   If indicated for the ordered procedure, I authorize the administration of contrast media per Radiology protocol    Answer:   Yes    Order Specific Question:   Preferred imaging location?    Answer:   [facility]    Order Specific Question:   Is Oral Contrast requested for this exam?    Answer:   No oral contrast    Order Specific Question:   Reason for No Oral Contrast    Answer:   Other    Order Specific Question:   Please answer why no oral contrast is requested    Answer:   not indicated    Order Specific Question:   Radiology Contrast Protocol - do NOT remove file path    Answer:   [email or web address]  . NM Bone Scan Whole Body    Standing Status:   Future    Standing Expiration Date:   04/30/2020    Order Specific Question:   If indicated for the ordered procedure, I authorize the administration of a radiopharmaceutical per Radiology protocol     Answer:   Yes    Order Specific Question:   Preferred imaging location?    Answer:  [facility]    Order Specific Question:   Radiology Contrast Protocol - do NOT remove file path    Answer:   [email or web address]  . Urinalysis, Routine w reflex microscopic     Return in about 3 weeks (around 04/20/2020) for He needs f/u with the Bone scan and CT reports and will need firmagon .    CC: Dr. Stoney Bang.      Irine Seal 03/30/2020 [phone]

## 2020-03-30 NOTE — Progress Notes (Signed)

## 2020-04-20 ENCOUNTER — Ambulatory Visit: Payer: PPO | Admitting: Urology

## 2020-04-26 ENCOUNTER — Encounter (HOSPITAL_COMMUNITY): Payer: Self-pay | Admitting: Radiology

## 2020-04-26 ENCOUNTER — Ambulatory Visit (HOSPITAL_COMMUNITY)
Admission: RE | Admit: 2020-04-26 | Discharge: 2020-04-26 | Disposition: A | Discharge status: Home / Self Care | Payer: PPO | Source: Ambulatory Visit | Attending: Urology | Admitting: Urology

## 2020-04-26 DIAGNOSIS — S4991XA Unspecified injury of right shoulder and upper arm, initial encounter: Secondary | ICD-10-CM | POA: Diagnosis not present

## 2020-04-26 DIAGNOSIS — C61 Malignant neoplasm of prostate: Secondary | ICD-10-CM | POA: Insufficient documentation

## 2020-04-26 DIAGNOSIS — S2241XA Multiple fractures of ribs, right side, initial encounter for closed fracture: Secondary | ICD-10-CM | POA: Diagnosis not present

## 2020-04-26 DIAGNOSIS — N281 Cyst of kidney, acquired: Secondary | ICD-10-CM | POA: Diagnosis not present

## 2020-04-26 DIAGNOSIS — I700 Atherosclerosis of aorta: Secondary | ICD-10-CM | POA: Diagnosis not present

## 2020-04-26 DIAGNOSIS — N400 Enlarged prostate without lower urinary tract symptoms: Secondary | ICD-10-CM | POA: Diagnosis not present

## 2020-04-26 LAB — POCT I-STAT CREATININE: Creatinine, Ser: 0.9 mg/dL (ref 0.61–1.24)

## 2020-04-26 MED ORDER — IOHEXOL 300 MG/ML  SOLN
100.0000 mL | Freq: Once | INTRAMUSCULAR | Status: AC | PRN
  Administered 2020-04-26: 100 mL via INTRAVENOUS

## 2020-04-26 MED ORDER — TECHNETIUM TC 99M MEDRONATE IV KIT
20.0000 | PACK | Freq: Once | INTRAVENOUS | Status: AC | PRN
  Administered 2020-04-26: 21.4 via INTRAVENOUS

## 2020-04-28 ENCOUNTER — Telehealth: Payer: Self-pay

## 2020-04-28 ENCOUNTER — Other Ambulatory Visit: Payer: Self-pay

## 2020-04-28 DIAGNOSIS — C61 Malignant neoplasm of prostate: Secondary | ICD-10-CM

## 2020-04-28 NOTE — Progress Notes (Signed)
Order put in for a referral to Dr. Tammi Klippel Radiation Oncology.

## 2020-04-28 NOTE — Telephone Encounter (Signed)
Pt called and notified of results and new test.

## 2020-04-28 NOTE — Progress Notes (Signed)
There are no obvious areas of prostate cancer spread on the CT or bonescan    but there are some rib lesions on the bone scan that are felt to possibly related to injury in the area but he needs a CT Chest without contrast to clarify and needs to be scheduled to see Dr. Tammi Klippel in [place] or at [place] if he would rather stay close to home for consideration of treatment of his prostate cancer.

## 2020-04-28 NOTE — Telephone Encounter (Signed)
-----   Message from Irine Seal, MD sent at 04/28/2020  9:19 AM EDT ----- There are no obvious areas of prostate cancer spread on the CT or bonescan    but there are some rib lesions on the bone scan that are felt to possibly related to injury in the area but he needs a CT Chest without contrast to clarify and needs to be scheduled to see Dr. Tammi Klippel in [place] or at [place] if he would rather stay close to home for consideration of treatment of his prostate cancer.

## 2020-05-04 ENCOUNTER — Encounter: Payer: Self-pay | Admitting: Urology

## 2020-05-04 ENCOUNTER — Ambulatory Visit (INDEPENDENT_AMBULATORY_CARE_PROVIDER_SITE_OTHER): Payer: PPO | Admitting: Urology

## 2020-05-04 ENCOUNTER — Other Ambulatory Visit: Payer: Self-pay

## 2020-05-04 DIAGNOSIS — N403 Nodular prostate with lower urinary tract symptoms: Secondary | ICD-10-CM

## 2020-05-04 DIAGNOSIS — C61 Malignant neoplasm of prostate: Secondary | ICD-10-CM | POA: Diagnosis not present

## 2020-05-04 DIAGNOSIS — N5201 Erectile dysfunction due to arterial insufficiency: Secondary | ICD-10-CM | POA: Diagnosis not present

## 2020-05-04 DIAGNOSIS — R351 Nocturia: Secondary | ICD-10-CM | POA: Diagnosis not present

## 2020-05-04 NOTE — Progress Notes (Signed)
Subjective: 1. Prostate cancer ([place])   2. Nodular prostate with lower urinary tract symptoms   3. Nocturia   4. Erectile dysfunction due to arterial insufficiency      05/04/20: Mr. Casey Waters returns today with the results of his staging studies.   He had no mets on CT.  There were some right rib lesions that were felt to most likely be post traumatic but a chest CT was recommended and is ordered for 4.14.  He has T2c N0 M0 ( if the chest CT is ok).  He has no new complaints or problems.  His IPSS is 5 with nocturia x 2.   03/30/20: Mr. Casey Waters is a 75 yo male who is sent for the evaluation of an elevated PSA of 11.5 or recent labs.   He has a history of high risk Gleason 8 prostate cancer found a biopsy on 11/05/18 for a PSA of 8.7 on 08/18/18.  He had 4 right prostate cores with cancer, 3 with GG3 and 1 with GG4.  He had 2 left cores, 1 with GG3 and 1 with GG2.   He had a right sided nodule for T2a disease.  His prostate volume was 36ml.   He was seen following the biopsy and treatment options were discussed.  He was scheduled for staging but never followed up.   He thought the Lord had cured him so he never followed up.  He has minimal voiding complaints with nocturia x 1-2.  He has had no hematuria.  He has no pain or weight loss.  He uses sildenafil for ED with success.   ROS:  ROS  No Known Allergies  Past Medical History:  Diagnosis Date  . Prostate CA [facility])     Past Surgical History:  Procedure Laterality Date  . HEMORROIDECTOMY    . INNER EAR SURGERY    . WRIST FRACTURE SURGERY Left     Social History   Socioeconomic History  . Marital status: Married    Spouse name: Not on file  . Number of children: 2  . Years of education: Not on file  . Highest education level: Not on file  Occupational History  . Occupation: retired  Tobacco Use  . Smoking status: Former Smoker    Packs/day: 0.03    Years: 3.00    Pack years: 0.09    Types: Cigarettes  . Smokeless tobacco: Never  Used  Substance and Sexual Activity  . Alcohol use: No  . Drug use: No  . Sexual activity: Not on file  Other Topics Concern  . Not on file  Social History Narrative  . Not on file   Social Determinants of Health   Financial Resource Strain: Not on file  Food Insecurity: Not on file  Transportation Needs: Not on file  Physical Activity: Not on file  Stress: Not on file  Social Connections: Not on file  Intimate Partner Violence: Not on file    Family History  Problem Relation Age of Onset  . Cancer Mother   . Cancer Sister   . Cancer Brother     Anti-infectives: Anti-infectives (From admission, onward)   None      Current Outpatient Medications  Medication Sig Dispense Refill  . Multiple Vitamin (MULTIVITAMIN) tablet Take 1 tablet by mouth daily.    Marland Kitchen neomycin-polymyxin-hydrocortisone (CORTISPORIN) 3.5-10000-1 OTIC suspension 3 (three) times daily.    . sildenafil (REVATIO) 20 MG tablet Take 20 mg by mouth as directed.    . Thiamine  HCl (VITAMIN B-1) 250 MG tablet Take 250 mg by mouth daily.     No current facility-administered medications for this visit.     Objective: Vital signs in last 24 hours: There were no vitals taken for this visit.  Intake/Output from previous day: No intake/output data recorded. Intake/Output this shift: @IOTHISSHIFT @   Physical Exam  Lab Results:  UA is clear.   Studies/Results:   CLINICAL DATA:  Prostate cancer, Gleason 8, initial staging examination. Remote fall with right arm and right rib injury.  EXAM: NUCLEAR MEDICINE WHOLE BODY BONE SCAN  TECHNIQUE: Whole body anterior and posterior images were obtained approximately 3 hours after intravenous injection of radiopharmaceutical.  RADIOPHARMACEUTICALS:  21.4 mCi Technetium-46m MDP IV  COMPARISON:  None. Findings are correlated with concurrently performed CT examination of the abdomen and pelvis.  FINDINGS: There is focal uptake of radiotracer within the  right third and fourth ribs anteriorly as well as the right fourth liver laterally and mid body of the sternum superior to the third costochondral junction. The anterior right fourth rib is just included on the recent CT examination and this demonstrates a subacute fracture with sclerosis along the fracture plane. While the remaining sites of uptake are not included, their proximity suggests additional foci of trauma.  Additional foci of uptake within the shoulders and sternoclavicular joints are likely degenerative in nature.  No other foci of abnormal uptake within the visualized axial and appendicular skeleton. Normal soft tissue distribution. Normal uptake and excretion within the kidneys and bladder.  IMPRESSION: Multiple foci of uptake involving the right third and fourth ribs as well as the sternum, likely traumatic given the presence of a fracture involving the right fourth rib anteriorly on recent CT examination and proximity of these lesions. This could be confirmed with CT imaging of the chest. No definite evidence of osseous metastatic disease.   Electronically Signed   By: Fidela Salisbury MD   On: 04/27/2020 06:49   EXAM: CT ABDOMEN AND PELVIS WITH CONTRAST  TECHNIQUE: Multidetector CT imaging of the abdomen and pelvis was performed using the standard protocol following bolus administration of intravenous contrast.  CONTRAST:  144mL OMNIPAQUE IOHEXOL 300 MG/ML  SOLN  COMPARISON:  None.  FINDINGS: Lower chest: No significant pulmonary nodules or acute consolidative airspace disease.  Hepatobiliary: Normal liver size. No liver mass. Normal gallbladder with no radiopaque cholelithiasis. No biliary ductal dilatation.  Pancreas: Normal, with no mass or duct dilation.  Spleen: Normal size. No mass.  Adrenals/Urinary Tract: Normal adrenals. Simple 1.5 cm lower right renal cyst. Otherwise normal kidneys, with no hydronephrosis.  Normal bladder.  Stomach/Bowel: Normal non-distended stomach. Normal caliber small bowel with no small bowel wall thickening. Normal appendix. Normal large bowel with no diverticulosis, large bowel wall thickening or pericolonic fat stranding.  Vascular/Lymphatic: Atherosclerotic nonaneurysmal abdominal aorta. Patent portal, splenic, hepatic and renal veins. No pathologically enlarged lymph nodes in the abdomen or pelvis.  Reproductive: Mildly enlarged prostate with hyperenhancing 1.4 x 1.4 cm prostate mass in the right peripheral zone at the apex (series 2/image 80).  Other: No pneumoperitoneum, ascites or focal fluid collection.  Musculoskeletal: No aggressive appearing focal osseous lesions. Moderate thoracolumbar spondylosis.  IMPRESSION: 1. Mildly enlarged prostate with hyperenhancing 1.4 x 1.4 cm prostate mass in the right peripheral zone at the apex, compatible with known prostate cancer. 2. No lymphadenopathy or other findings of metastatic disease in the abdomen or pelvis. 3. Aortic Atherosclerosis (ICD10-I70.0).   Electronically Signed   By: Rinaldo Ratel  Poff M.D.   On: 04/26/2020 16:19   Assessment/Plan: He has Gleason 8 T2c N0 M0 (pending the chest CT to assess the rib lesions that are felt to be traumatic).   I will wait until after the CT to initiate firmagon and I have reviewed the side effects of ADT in detail.  He will need 18 months of therapy and can be transitioned to Eligard after the initial firmagon.   He has a referral in progress for a Radiation oncology consultation.   He will return with a PSA and testosterone level 1 month after the firmagon for his next injection.   ED with responsed to sildenafil.  I informed him of the impact of prostate cancer treatment on sexual function.   No orders of the defined types were placed in this encounter.    Orders Placed This Encounter  Procedures  . PSA    Standing Status:   Future    Standing  Expiration Date:   07/04/2020  . Testosterone    Standing Status:   Future    Standing Expiration Date:   07/04/2020     Return in about 2 weeks (around 05/18/2020) for Nurse visit for firmagon 240mg  in 2 weeks and then will need f/u 1 month later with labs. .    CC: Dr. Stoney Bang.      Irine Seal 05/04/2020 [phone]

## 2020-05-10 ENCOUNTER — Encounter: Payer: Self-pay | Admitting: Radiation Oncology

## 2020-05-10 NOTE — Progress Notes (Signed)
Radiation Oncology         [phone] ________________________________  Initial Outpatient Consultation - Conducted via telephone due to current COVID-19 concerns for limiting patient exposure  I spoke with the patient to conduct this consult visit via telephone to spare the patient unnecessary potential exposure in the healthcare setting during the current COVID-19 pandemic. The patient was notified in advance and was offered a [place] meeting to allow for face to face communication but unfortunately reported that they did not have the appropriate resources/technology to support such a visit and instead preferred to proceed with a telephone consult.    Name: Casey Waters MRN: [id number]  Date: 05/11/2020  DOB: November 15, 1945  FF:[id number], Samul Dada, MD  Irine Seal, MD   REFERRING PHYSICIAN: Irine Seal, MD  DIAGNOSIS: 75 y.o. gentleman with Stage T2c adenocarcinoma of the prostate with Gleason score of 4+4, and PSA of 11.5.    ICD-10-CM   1. Prostate cancer [facility])  C61 Ambulatory referral to Radiation Oncology    HISTORY OF PRESENT ILLNESS: LEBRON NAUERT is a 75 y.o. male with a diagnosis of prostate cancer. He was initially referred to Dr. Karsten Ro in 08/2018 for an elevated PSA of 8.7. Digital rectal examination was performed at that time revealing a 10 mm right apex nodule.  The patient proceeded to transrectal ultrasound with 12 biopsies of the prostate on 11/05/18.  The prostate volume measured 33 cc.  Out of 12 core biopsies, 6 were positive.  The maximum Gleason score was 4+4, and this was seen in right mid. Additionally, Gleason 4+3 was seen in right apex lateral, right mid lateral (with perineural invasion), right base, and left mid lateral, and Gleason 3+4 in left apex lateral. He was scheduled for staging but never followed up.  More recently, he was referred to Dr. Jeffie Pollock on 03/30/20 for an elevated PSA of 11.5. Repeat rectal examination showed right induration and nodularity with some  induration of the left mid prostate. He underwent staging CT and bone scan on 04/26/20. CT A/P showed: mildly enlarged prostate with hyperenhancing 1.4 cm prostate mass in the right peripheral zone at the apex, compatible with known prostate cancer; no lymphadenopathy or other findings of metastatic disease. Bone scan showed: multiple foci of uptake involving the right third and fourth ribs as well as the sternum, likely traumatic given the presence of a fracture involving the right fourth rib anteriorly on recent CT examination and proximity of these lesions;. no definite evidence of osseous metastatic disease.   He is scheduled for chest CT later tonight. He is also scheduled to receive firmagon on 05/19/20.  The patient reviewed the biopsy results with his urologist and he has kindly been referred today for discussion of potential radiation treatment options.   PREVIOUS RADIATION THERAPY: No  PAST MEDICAL HISTORY:  Past Medical History:  Diagnosis Date  . Prostate CA [facility])       PAST SURGICAL HISTORY: Past Surgical History:  Procedure Laterality Date  . HEMORROIDECTOMY    . INNER EAR SURGERY    . PROSTATE BIOPSY    . WRIST FRACTURE SURGERY Left     FAMILY HISTORY:  Family History  Problem Relation Age of Onset  . Cancer Mother        unknown  . Cancer Sister        unknown  . Cancer Brother        unknown  . Breast cancer Neg Hx   . Prostate cancer Neg Hx   .  Colon cancer Neg Hx   . Pancreatic cancer Neg Hx     SOCIAL HISTORY:  Social History   Socioeconomic History  . Marital status: Married    Spouse name: Not on file  . Number of children: 2  . Years of education: Not on file  . Highest education level: Not on file  Occupational History  . Occupation: retired  Tobacco Use  . Smoking status: Former Smoker    Packs/day: 0.03    Years: 3.00    Pack years: 0.09    Types: Cigarettes    Quit date: 01/29/1968    Years since quitting: 52.3  . Smokeless tobacco:  Never Used  Vaping Use  . Vaping Use: Never used  Substance and Sexual Activity  . Alcohol use: No  . Drug use: No  . Sexual activity: Yes  Other Topics Concern  . Not on file  Social History Narrative  . Not on file   Social Determinants of Health   Financial Resource Strain: Not on file  Food Insecurity: Not on file  Transportation Needs: Not on file  Physical Activity: Not on file  Stress: Not on file  Social Connections: Not on file  Intimate Partner Violence: Not on file    ALLERGIES: Patient has no known allergies.  MEDICATIONS:  Current Outpatient Medications  Medication Sig Dispense Refill  . Multiple Vitamin (MULTIVITAMIN) tablet Take 1 tablet by mouth daily.    . sildenafil (REVATIO) 20 MG tablet Take 20 mg by mouth as directed.    . Thiamine HCl (VITAMIN B-1) 250 MG tablet Take 250 mg by mouth daily.     No current facility-administered medications for this encounter.    REVIEW OF SYSTEMS:  On review of systems, the patient reports that he is doing well overall. He denies any chest pain, shortness of breath, cough, fevers, chills, night sweats, unintended weight changes. He denies any bowel disturbances, and denies abdominal pain, nausea or vomiting. He denies any new musculoskeletal or joint aches or pains. His IPSS was 3, indicating mild urinary symptoms. His SHIM was 20, indicating he does not have  erectile dysfunction. A complete review of systems is obtained and is otherwise negative.    PHYSICAL EXAM:  Unable to assess due to encounter type.   KPS = 90  100 - Normal; no complaints; no evidence of disease. 90   - Able to carry on normal activity; minor signs or symptoms of disease. 80   - Normal activity with effort; some signs or symptoms of disease. 45   - Cares for self; unable to carry on normal activity or to do active work. 60   - Requires occasional assistance, but is able to care for most of his personal needs. 50   - Requires considerable  assistance and frequent medical care. 76   - Disabled; requires special care and assistance. 13   - Severely disabled; hospital admission is indicated although death not imminent. 40   - Very sick; hospital admission necessary; active supportive treatment necessary. 10   - Moribund; fatal processes progressing rapidly. 0     - Dead  Karnofsky DA, Abelmann WH, Craver LS and Burchenal JH [phone]) The use of the nitrogen mustards in the palliative treatment of carcinoma: with particular reference to bronchogenic carcinoma Cancer 1 634-56  LABORATORY DATA:  No results found for: WBC, HGB, HCT, MCV, PLT No results found for: NA, K, CL, CO2 No results found for: ALT, AST, GGT, ALKPHOS, BILITOT  RADIOGRAPHY: NM Bone Scan Whole Body  Result Date: 04/27/2020 CLINICAL DATA:  Prostate cancer, Gleason 8, initial staging examination. Remote fall with right arm and right rib injury. EXAM: NUCLEAR MEDICINE WHOLE BODY BONE SCAN TECHNIQUE: Whole body anterior and posterior images were obtained approximately 3 hours after intravenous injection of radiopharmaceutical. RADIOPHARMACEUTICALS:  21.4 mCi Technetium-97m MDP IV COMPARISON:  None. Findings are correlated with concurrently performed CT examination of the abdomen and pelvis. FINDINGS: There is focal uptake of radiotracer within the right third and fourth ribs anteriorly as well as the right fourth liver laterally and mid body of the sternum superior to the third costochondral junction. The anterior right fourth rib is just included on the recent CT examination and this demonstrates a subacute fracture with sclerosis along the fracture plane. While the remaining sites of uptake are not included, their proximity suggests additional foci of trauma. Additional foci of uptake within the shoulders and sternoclavicular joints are likely degenerative in nature. No other foci of abnormal uptake within the visualized axial and appendicular skeleton. Normal soft tissue  distribution. Normal uptake and excretion within the kidneys and bladder. IMPRESSION: Multiple foci of uptake involving the right third and fourth ribs as well as the sternum, likely traumatic given the presence of a fracture involving the right fourth rib anteriorly on recent CT examination and proximity of these lesions. This could be confirmed with CT imaging of the chest. No definite evidence of osseous metastatic disease. Electronically Signed   By: Fidela Salisbury MD   On: 04/27/2020 06:49   CT ABDOMEN PELVIS W CONTRAST  Result Date: 04/26/2020 CLINICAL DATA:  Gleason 8 prostate cancer staging. EXAM: CT ABDOMEN AND PELVIS WITH CONTRAST TECHNIQUE: Multidetector CT imaging of the abdomen and pelvis was performed using the standard protocol following bolus administration of intravenous contrast. CONTRAST:  160mL OMNIPAQUE IOHEXOL 300 MG/ML  SOLN COMPARISON:  None. FINDINGS: Lower chest: No significant pulmonary nodules or acute consolidative airspace disease. Hepatobiliary: Normal liver size. No liver mass. Normal gallbladder with no radiopaque cholelithiasis. No biliary ductal dilatation. Pancreas: Normal, with no mass or duct dilation. Spleen: Normal size. No mass. Adrenals/Urinary Tract: Normal adrenals. Simple 1.5 cm lower right renal cyst. Otherwise normal kidneys, with no hydronephrosis. Normal bladder. Stomach/Bowel: Normal non-distended stomach. Normal caliber small bowel with no small bowel wall thickening. Normal appendix. Normal large bowel with no diverticulosis, large bowel wall thickening or pericolonic fat stranding. Vascular/Lymphatic: Atherosclerotic nonaneurysmal abdominal aorta. Patent portal, splenic, hepatic and renal veins. No pathologically enlarged lymph nodes in the abdomen or pelvis. Reproductive: Mildly enlarged prostate with hyperenhancing 1.4 x 1.4 cm prostate mass in the right peripheral zone at the apex (series 2/image 80). Other: No pneumoperitoneum, ascites or focal fluid  collection. Musculoskeletal: No aggressive appearing focal osseous lesions. Moderate thoracolumbar spondylosis. IMPRESSION: 1. Mildly enlarged prostate with hyperenhancing 1.4 x 1.4 cm prostate mass in the right peripheral zone at the apex, compatible with known prostate cancer. 2. No lymphadenopathy or other findings of metastatic disease in the abdomen or pelvis. 3. Aortic Atherosclerosis (ICD10-I70.0). Electronically Signed   By: Ilona Sorrel M.D.   On: 04/26/2020 16:19      IMPRESSION/PLAN:  1. 75 y.o. gentleman with Stage T2c adenocarcinoma of the prostate with Gleason Score of 4+4, and PSA of 11.5.We discussed the patient's workup and outlined the nature of prostate cancer in this setting. The patient's T stage, Gleason's score, and PSA put him into the high risk group. Accordingly, he is eligible for a variety  of potential treatment options including ADT in combination with 8 weeks of external radiation or ADT in combination with 5 weeks of external radiation preceded by a brachytherapy boost. We discussed the available radiation techniques, and focused on the details and logistics and delivery. We discussed and outlined the risks, benefits, short and long-term effects associated with radiotherapy and compared and contrasted these with prostatectomy. We discussed the role of SpaceOAR in reducing the rectal toxicity associated with radiotherapy. We also reviewed the role of ADT in the treatment of high risk prostate cancer and outlined the associated side effects that could be expected with this therapy.   He unfortunately does not have a good understanding of his disease despite our discussion and discussion with other providers, and states he's changed his mind about treatment overall. We did discuss 8 weeks of radiotherapy would probably be a more appropriate therapy with ADT as coordinating his care is difficult due to his health literacy. He was in agreement to meet with radiation oncology in [place]  to potentially consider treatment closer to home if he were to decide to pursue care. We also offered to include his wife and his adult children in the discussion which he declined.  Given current concerns for patient exposure during the COVID-19 pandemic, this encounter was conducted via telephone.  The patient has provided two factor identification and has given verbal consent for this type of encounter and has been advised to only accept a meeting of this type in a secure network environment. The time spent during this encounter was 45 minutes including preparation, discussion, and coordination of the patient's care. The attendants for this meeting include London Pepper, RN, Dr. Tammi Klippel, Hayden Pedro  and Dominic Pea.  During the encounter,  London Pepper, RN, Dr. Tammi Klippel, and Hayden Pedro were located at [facility] Radiation Oncology Department.  JEMERY STACEY was located at home.    ------------------------------------------------    Carola Rhine, [facility] contacted with      Tyler Pita, MD [place] Director and Director of Stereotactic Radiosurgery Direct Dial: [phone]  Fax: [phone] [email or web address]  Skype  LinkedIn   This document serves as a record of services personally performed by Tyler Pita, MD. It was created on his behalf by Wilburn Mylar, a trained medical scribe. The creation of this record is based on the scribe's personal observations and the provider's statements to them. This document has been checked and approved by the attending provider.

## 2020-05-10 NOTE — Progress Notes (Addendum)
GU Location of Tumor / Histology: Prostatic adenocarcinoma  If Prostate Cancer, Gleason Score is (4 + 4) and PSA is (8.7) at diagnosis. Prostate volume: 33 at diagnosis on 11/05/2018.   02/15/2020 psa 11.5  Casey Waters was diagnosed with gleason 8 prostate cancer on 11/05/2018. Patient failed to show up for staging.Per Dr. Ralene Muskrat note the patient failed to follow up for staging because he "thought the Lord would heal him."  Patient was referred back to Dr. Jeffie Pollock by Dr. Sherrie Sport on February 15, 2020 after he discovered an elevated psa of 11.5.   Biopsies of prostate (if applicable) revealed:    Past/Anticipated interventions by urology, if any: prostate biopsy, CT abd/pelvis (negative), chest CT (negative), referral to Dr. Tammi Klippel to discuss radiation therapy options  Past/Anticipated interventions by medical oncology, if any: no  Weight changes, if any: denies  Bowel/Bladder complaints, if any: IPSS 3. SHIM 20. Denies dysuria or hematuria. Denies urinary leakage or incontinence. Reports nocturia x 2. Reports mild ED. Denies any bowel complaints.  Nausea/Vomiting, if any: denies  Pain issues, if any:  denies  SAFETY ISSUES:  Prior radiation? denies  Pacemaker/ICD? denies  Possible current pregnancy? no, male patient  Is the patient on methotrexate? denies  Current Complaints / other details:  75 yo male. Married with 2 children. Retired. [place] in [place]. Reports his mother, sister and brother all have cancer but, type is unknown.

## 2020-05-11 ENCOUNTER — Ambulatory Visit (HOSPITAL_COMMUNITY)
Admission: RE | Admit: 2020-05-11 | Discharge: 2020-05-11 | Disposition: A | Discharge status: Home / Self Care | Payer: PPO | Source: Ambulatory Visit | Attending: Urology | Admitting: Urology

## 2020-05-11 ENCOUNTER — Encounter: Payer: Self-pay | Admitting: Radiation Oncology

## 2020-05-11 ENCOUNTER — Ambulatory Visit
Admission: RE | Admit: 2020-05-11 | Discharge: 2020-05-11 | Disposition: A | Discharge status: Home / Self Care | Payer: PPO | Source: Ambulatory Visit | Attending: Radiation Oncology | Admitting: Radiation Oncology

## 2020-05-11 ENCOUNTER — Other Ambulatory Visit: Payer: Self-pay

## 2020-05-11 VITALS — Ht 67.0 in | Wt 164.0 lb

## 2020-05-11 DIAGNOSIS — C61 Malignant neoplasm of prostate: Secondary | ICD-10-CM | POA: Diagnosis not present

## 2020-05-11 DIAGNOSIS — I2510 Atherosclerotic heart disease of native coronary artery without angina pectoris: Secondary | ICD-10-CM | POA: Diagnosis not present

## 2020-05-11 DIAGNOSIS — S2243XD Multiple fractures of ribs, bilateral, subsequent encounter for fracture with routine healing: Secondary | ICD-10-CM | POA: Diagnosis not present

## 2020-05-11 DIAGNOSIS — R972 Elevated prostate specific antigen [PSA]: Secondary | ICD-10-CM | POA: Diagnosis not present

## 2020-05-11 DIAGNOSIS — J218 Acute bronchiolitis due to other specified organisms: Secondary | ICD-10-CM | POA: Diagnosis not present

## 2020-05-11 DIAGNOSIS — S2241XD Multiple fractures of ribs, right side, subsequent encounter for fracture with routine healing: Secondary | ICD-10-CM | POA: Diagnosis not present

## 2020-05-15 ENCOUNTER — Telehealth: Payer: Self-pay

## 2020-05-15 NOTE — Telephone Encounter (Signed)
-----   Message from Irine Seal, MD sent at 05/15/2020 10:19 AM EDT ----- Bone lesions on bone scan are healing fractures.  Please CC to Dr. Sherrie Sport regarding the lung findings.

## 2020-05-15 NOTE — Telephone Encounter (Signed)
Left message on voicemail to return call.

## 2020-05-16 ENCOUNTER — Telehealth: Payer: Self-pay

## 2020-05-16 DIAGNOSIS — G4700 Insomnia, unspecified: Secondary | ICD-10-CM | POA: Diagnosis not present

## 2020-05-16 DIAGNOSIS — I10 Essential (primary) hypertension: Secondary | ICD-10-CM | POA: Diagnosis not present

## 2020-05-16 DIAGNOSIS — G5690 Unspecified mononeuropathy of unspecified upper limb: Secondary | ICD-10-CM | POA: Diagnosis not present

## 2020-05-16 DIAGNOSIS — Z6826 Body mass index (BMI) 26.0-26.9, adult: Secondary | ICD-10-CM | POA: Diagnosis not present

## 2020-05-16 DIAGNOSIS — Z0000 Encounter for general adult medical examination without abnormal findings: Secondary | ICD-10-CM | POA: Diagnosis not present

## 2020-05-16 NOTE — Telephone Encounter (Signed)
Left message to return call 

## 2020-05-16 NOTE — Telephone Encounter (Signed)
-----   Message from Irine Seal, MD sent at 05/11/2020  2:56 PM EDT ----- Mr. Bonzo is declining therapy for his prostate cancer.   Could we see about getting him back in in about 3-4 month with a PSA.

## 2020-05-18 ENCOUNTER — Encounter (INDEPENDENT_AMBULATORY_CARE_PROVIDER_SITE_OTHER): Payer: Self-pay | Admitting: *Deleted

## 2020-05-19 ENCOUNTER — Ambulatory Visit: Payer: PPO

## 2020-06-22 ENCOUNTER — Other Ambulatory Visit: Payer: PPO

## 2020-06-29 ENCOUNTER — Ambulatory Visit: Payer: PPO | Admitting: Urology

## 2020-08-29 DIAGNOSIS — I700 Atherosclerosis of aorta: Secondary | ICD-10-CM | POA: Diagnosis not present

## 2020-08-29 DIAGNOSIS — Z0000 Encounter for general adult medical examination without abnormal findings: Secondary | ICD-10-CM | POA: Diagnosis not present

## 2020-08-29 DIAGNOSIS — I10 Essential (primary) hypertension: Secondary | ICD-10-CM | POA: Diagnosis not present

## 2020-08-29 DIAGNOSIS — Z6826 Body mass index (BMI) 26.0-26.9, adult: Secondary | ICD-10-CM | POA: Diagnosis not present

## 2020-08-29 DIAGNOSIS — G4700 Insomnia, unspecified: Secondary | ICD-10-CM | POA: Diagnosis not present

## 2020-08-29 DIAGNOSIS — G5690 Unspecified mononeuropathy of unspecified upper limb: Secondary | ICD-10-CM | POA: Diagnosis not present

## 2020-12-07 DIAGNOSIS — G5690 Unspecified mononeuropathy of unspecified upper limb: Secondary | ICD-10-CM | POA: Diagnosis not present

## 2020-12-07 DIAGNOSIS — I700 Atherosclerosis of aorta: Secondary | ICD-10-CM | POA: Diagnosis not present

## 2020-12-07 DIAGNOSIS — G4700 Insomnia, unspecified: Secondary | ICD-10-CM | POA: Diagnosis not present

## 2020-12-07 DIAGNOSIS — Z6826 Body mass index (BMI) 26.0-26.9, adult: Secondary | ICD-10-CM | POA: Diagnosis not present

## 2020-12-07 DIAGNOSIS — I10 Essential (primary) hypertension: Secondary | ICD-10-CM | POA: Diagnosis not present

## 2021-03-08 DIAGNOSIS — G5690 Unspecified mononeuropathy of unspecified upper limb: Secondary | ICD-10-CM | POA: Diagnosis not present

## 2021-03-08 DIAGNOSIS — G4700 Insomnia, unspecified: Secondary | ICD-10-CM | POA: Diagnosis not present

## 2021-03-08 DIAGNOSIS — Z0000 Encounter for general adult medical examination without abnormal findings: Secondary | ICD-10-CM | POA: Diagnosis not present

## 2021-03-08 DIAGNOSIS — Z6825 Body mass index (BMI) 25.0-25.9, adult: Secondary | ICD-10-CM | POA: Diagnosis not present

## 2021-03-08 DIAGNOSIS — I700 Atherosclerosis of aorta: Secondary | ICD-10-CM | POA: Diagnosis not present

## 2021-03-08 DIAGNOSIS — I10 Essential (primary) hypertension: Secondary | ICD-10-CM | POA: Diagnosis not present

## 2021-03-14 ENCOUNTER — Encounter: Payer: Self-pay | Admitting: *Deleted

## 2021-05-15 ENCOUNTER — Ambulatory Visit: Payer: PPO

## 2021-06-20 DIAGNOSIS — Z1389 Encounter for screening for other disorder: Secondary | ICD-10-CM | POA: Diagnosis not present

## 2021-06-20 DIAGNOSIS — I10 Essential (primary) hypertension: Secondary | ICD-10-CM | POA: Diagnosis not present

## 2021-06-20 DIAGNOSIS — Z125 Encounter for screening for malignant neoplasm of prostate: Secondary | ICD-10-CM | POA: Diagnosis not present

## 2021-06-20 DIAGNOSIS — G4700 Insomnia, unspecified: Secondary | ICD-10-CM | POA: Diagnosis not present

## 2021-06-20 DIAGNOSIS — Z6826 Body mass index (BMI) 26.0-26.9, adult: Secondary | ICD-10-CM | POA: Diagnosis not present

## 2021-06-20 DIAGNOSIS — Z0000 Encounter for general adult medical examination without abnormal findings: Secondary | ICD-10-CM | POA: Diagnosis not present

## 2021-06-20 DIAGNOSIS — I700 Atherosclerosis of aorta: Secondary | ICD-10-CM | POA: Diagnosis not present

## 2021-06-26 ENCOUNTER — Encounter (INDEPENDENT_AMBULATORY_CARE_PROVIDER_SITE_OTHER): Payer: Self-pay | Admitting: *Deleted

## 2021-08-27 DIAGNOSIS — Z0100 Encounter for examination of eyes and vision without abnormal findings: Secondary | ICD-10-CM | POA: Diagnosis not present

## 2021-08-27 DIAGNOSIS — H5203 Hypermetropia, bilateral: Secondary | ICD-10-CM | POA: Diagnosis not present

## 2021-08-27 DIAGNOSIS — H524 Presbyopia: Secondary | ICD-10-CM | POA: Diagnosis not present

## 2021-09-26 DIAGNOSIS — G4700 Insomnia, unspecified: Secondary | ICD-10-CM | POA: Diagnosis not present

## 2021-09-26 DIAGNOSIS — Z6824 Body mass index (BMI) 24.0-24.9, adult: Secondary | ICD-10-CM | POA: Diagnosis not present

## 2021-09-26 DIAGNOSIS — I10 Essential (primary) hypertension: Secondary | ICD-10-CM | POA: Diagnosis not present

## 2021-09-26 DIAGNOSIS — I700 Atherosclerosis of aorta: Secondary | ICD-10-CM | POA: Diagnosis not present

## 2021-12-03 ENCOUNTER — Encounter (INDEPENDENT_AMBULATORY_CARE_PROVIDER_SITE_OTHER): Payer: Self-pay | Admitting: *Deleted

## 2021-12-26 DIAGNOSIS — I700 Atherosclerosis of aorta: Secondary | ICD-10-CM | POA: Diagnosis not present

## 2021-12-26 DIAGNOSIS — G4700 Insomnia, unspecified: Secondary | ICD-10-CM | POA: Diagnosis not present

## 2021-12-26 DIAGNOSIS — Z6825 Body mass index (BMI) 25.0-25.9, adult: Secondary | ICD-10-CM | POA: Diagnosis not present

## 2021-12-26 DIAGNOSIS — N401 Enlarged prostate with lower urinary tract symptoms: Secondary | ICD-10-CM | POA: Diagnosis not present

## 2021-12-26 DIAGNOSIS — I10 Essential (primary) hypertension: Secondary | ICD-10-CM | POA: Diagnosis not present

## 2022-01-24 DIAGNOSIS — C61 Malignant neoplasm of prostate: Secondary | ICD-10-CM | POA: Diagnosis not present

## 2022-02-12 ENCOUNTER — Other Ambulatory Visit (HOSPITAL_COMMUNITY): Payer: Self-pay | Admitting: Urology

## 2022-02-12 DIAGNOSIS — C61 Malignant neoplasm of prostate: Secondary | ICD-10-CM

## 2022-03-24 IMAGING — CT CT ABD-PELV W/ CM
2 of 5 series · 16 of 46 positions shown, 18 images · IV contrast (Omnipaque or Isovue)
Comparison: None.

CLINICAL DATA: Gleason 8 prostate cancer staging.

EXAM:
CT ABDOMEN AND PELVIS WITH CONTRAST
TECHNIQUE: Multidetector CT imaging of the abdomen and pelvis was performed
using the standard protocol following bolus administration of
intravenous contrast.
CONTRAST:  100mL OMNIPAQUE IOHEXOL 300 MG/ML  SOLN

[Series 2: axial st · axial · 0.85mm/px · z∈[+774,+1189]mm · 13 of 93 slices shown, 15 images]
[im 5/93  soft-tissue]
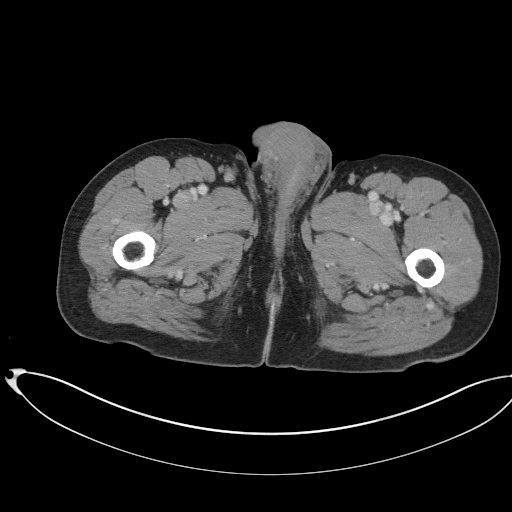
[im 5/93  bone]
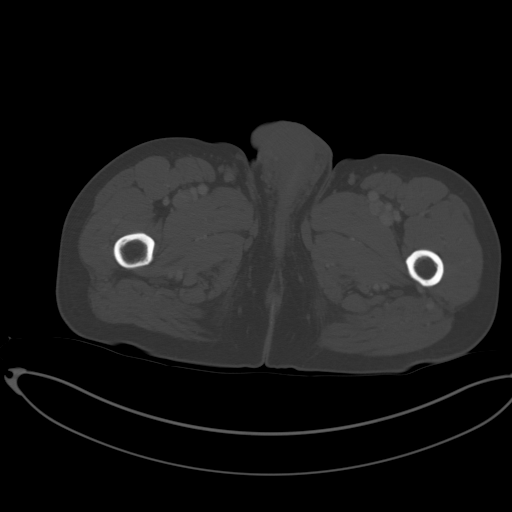
[im 15/93  soft-tissue]
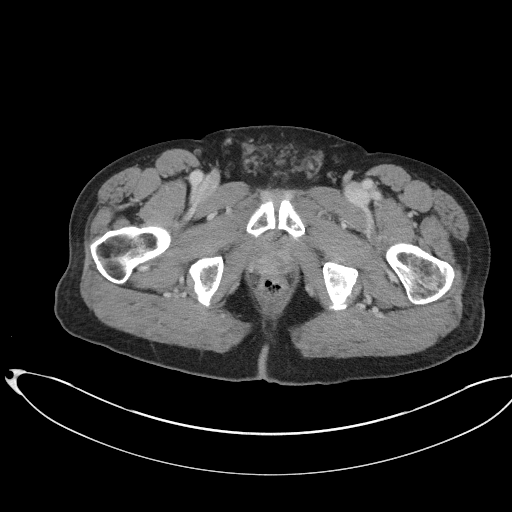
[im 20/93  soft-tissue]
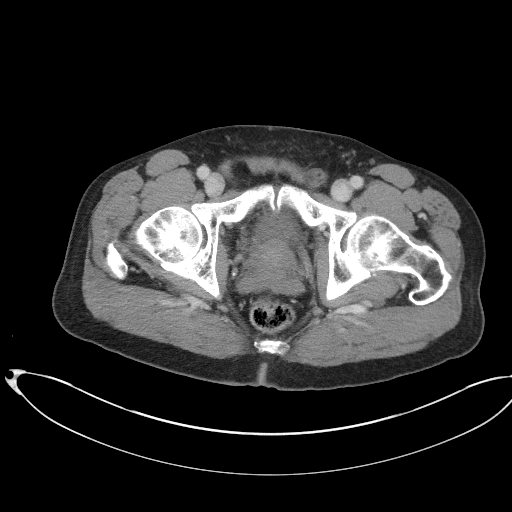
[im 25/93  soft-tissue]
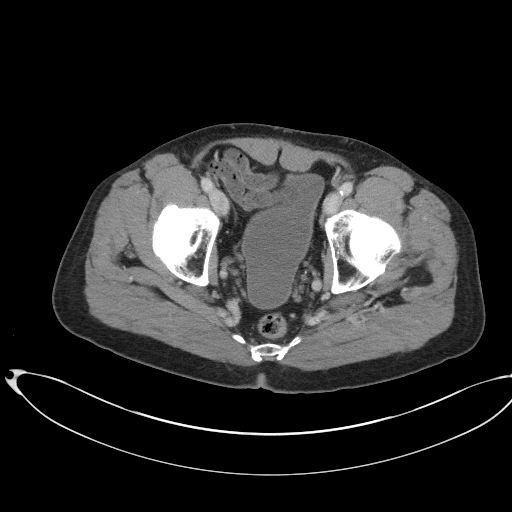
[im 34/93  soft-tissue]
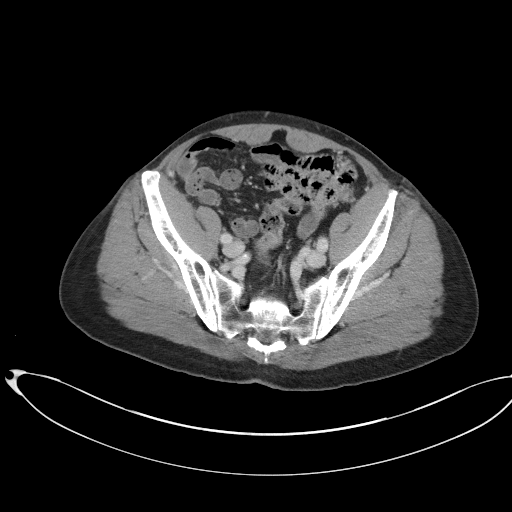
[im 39/93  soft-tissue]
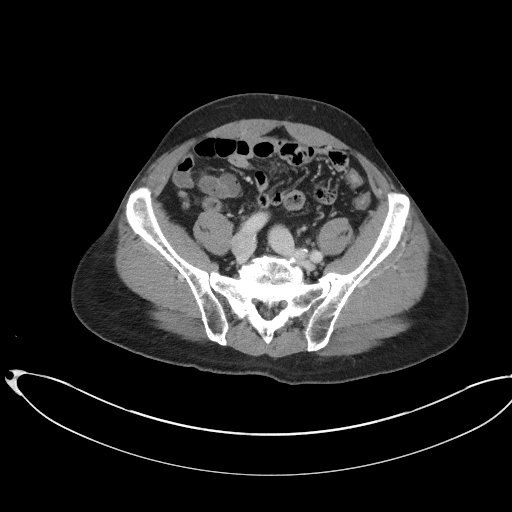
[im 49/93  soft-tissue]
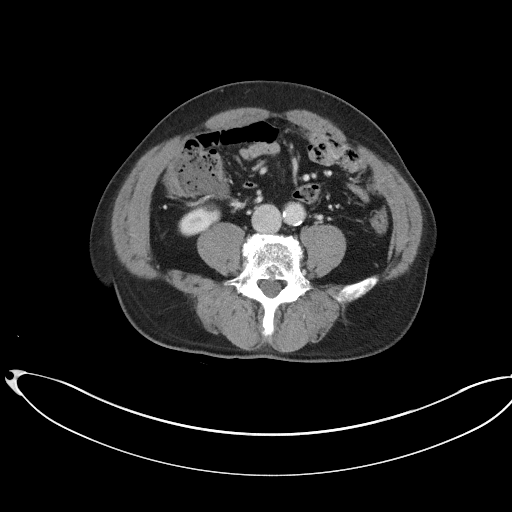
[im 54/93  soft-tissue]
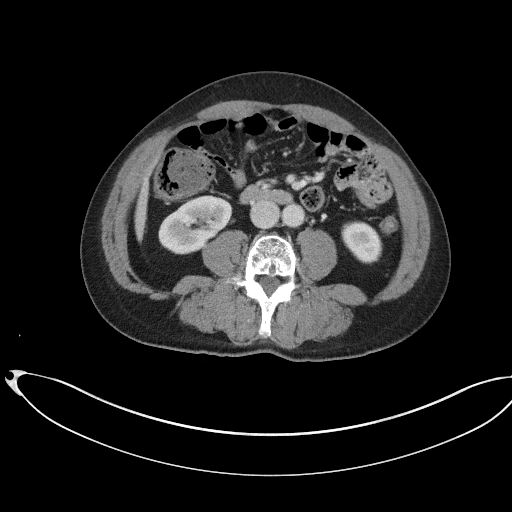
[im 59/93  soft-tissue]
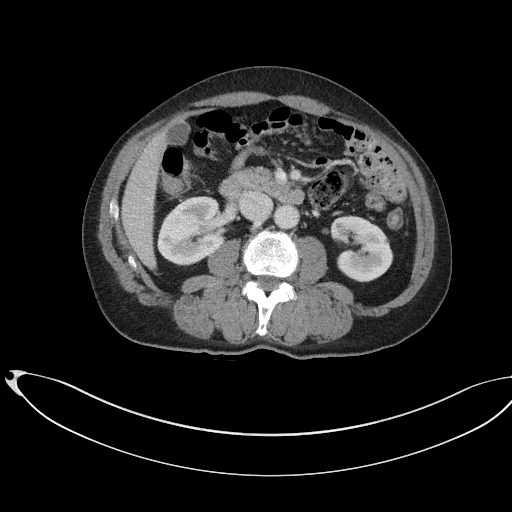
[im 59/93  bone]
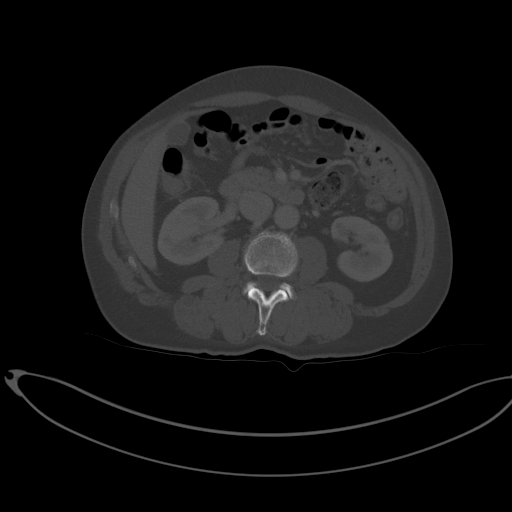
[im 68/93  soft-tissue]
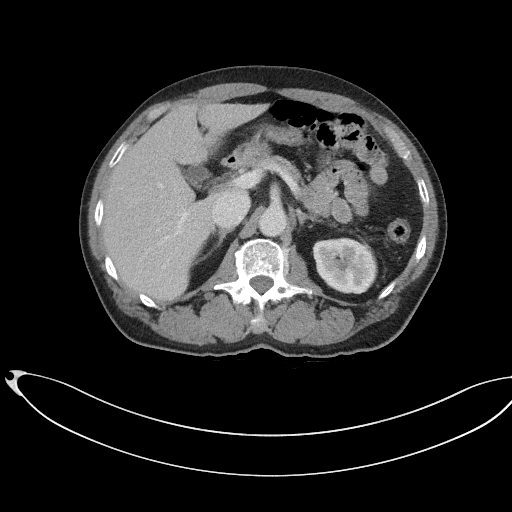
[im 73/93  soft-tissue]
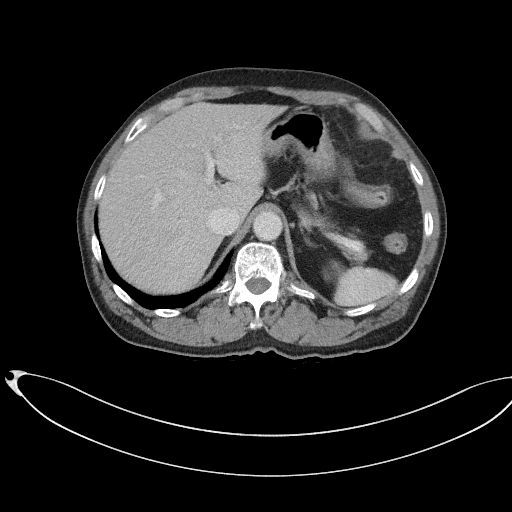
[im 78/93  soft-tissue]
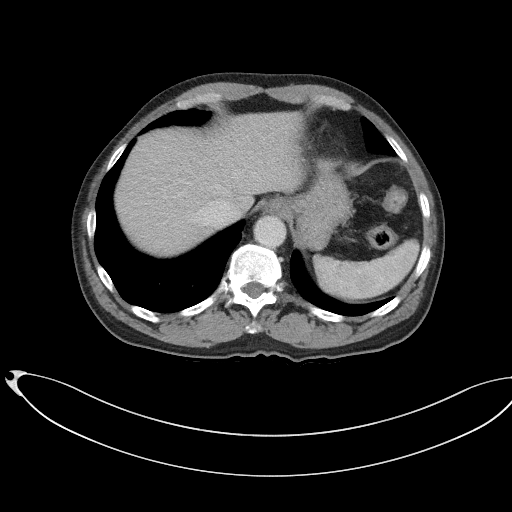
[im 88/93  soft-tissue]
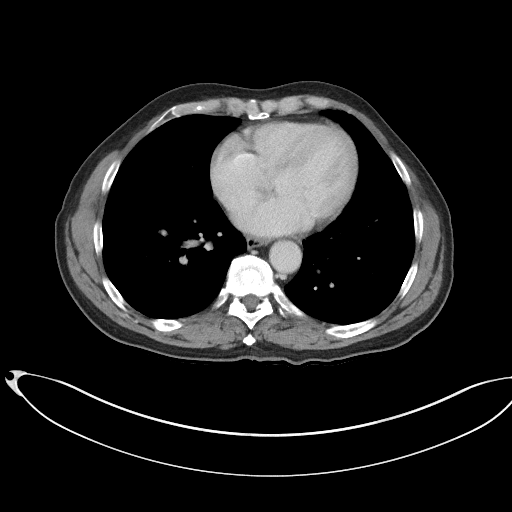

[Series 5: coronal st · coronal · 0.71mm/px · 3 of 104 slices shown]
[im 35/104  soft-tissue]
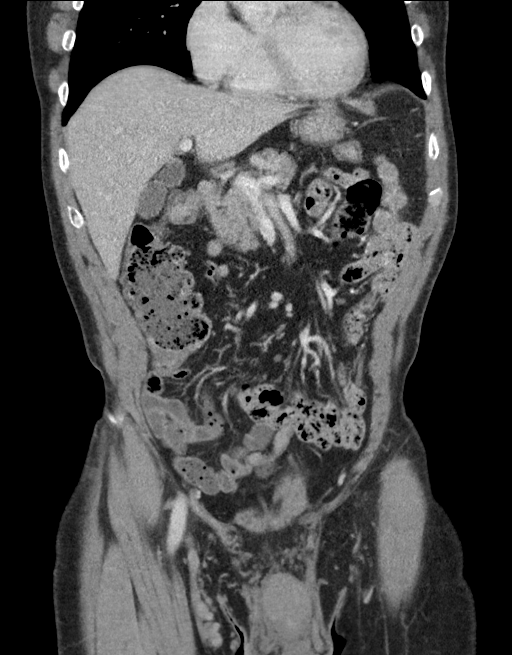
[im 46/104  soft-tissue]
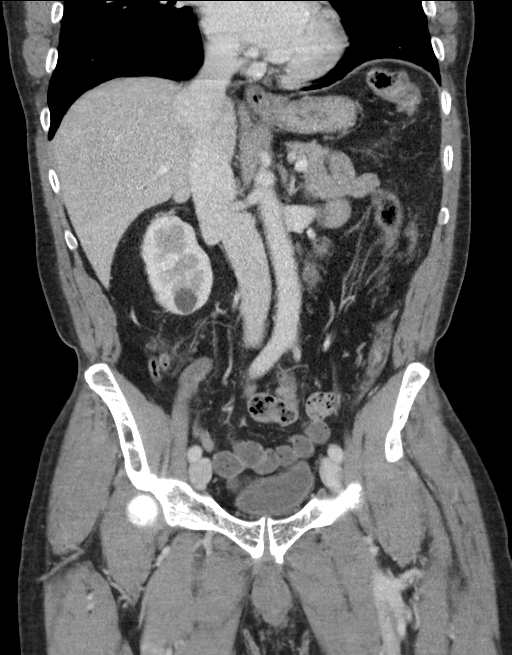
[im 58/104  soft-tissue]
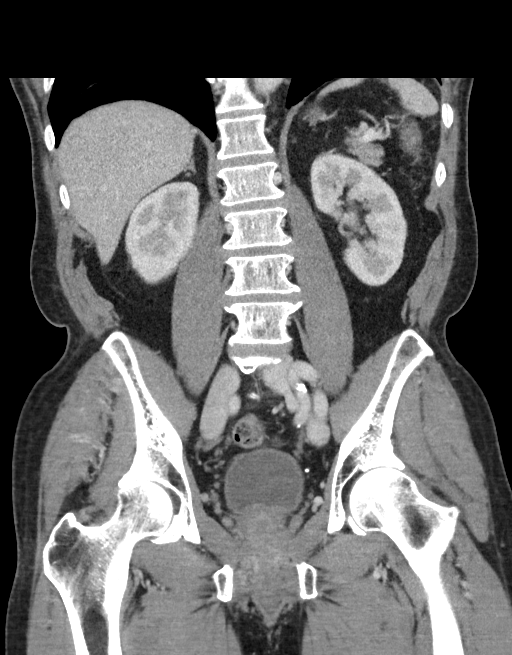

[16 of 46 positions shown; findings below may reference images not displayed]

FINDINGS: Lower chest: No significant pulmonary nodules or acute consolidative
airspace disease.

Hepatobiliary: Normal liver size. No liver mass. Normal gallbladder
with no radiopaque cholelithiasis. No biliary ductal dilatation.

Pancreas: Normal, with no mass or duct dilation.

Spleen: Normal size. No mass.

Adrenals/Urinary Tract: Normal adrenals. Simple 1.5 cm lower right
renal cyst. Otherwise normal kidneys, with no hydronephrosis. Normal
bladder.

Stomach/Bowel: Normal non-distended stomach. Normal caliber small
bowel with no small bowel wall thickening. Normal appendix. Normal
large bowel with no diverticulosis, large bowel wall thickening or
pericolonic fat stranding.

Vascular/Lymphatic: Atherosclerotic nonaneurysmal abdominal aorta.
Patent portal, splenic, hepatic and renal veins. No pathologically
enlarged lymph nodes in the abdomen or pelvis.

Reproductive: Mildly enlarged prostate with hyperenhancing 1.4 x
cm prostate mass in the right peripheral zone at the apex (series
2/image 80).

Other: No pneumoperitoneum, ascites or focal fluid collection.

Musculoskeletal: No aggressive appearing focal osseous lesions.
Moderate thoracolumbar spondylosis.
IMPRESSION: 1. Mildly enlarged prostate with hyperenhancing 1.4 x 1.4 cm
prostate mass in the right peripheral zone at the apex, compatible
with known prostate cancer.
2. No lymphadenopathy or other findings of metastatic disease in the
abdomen or pelvis.
3. Aortic Atherosclerosis ([id number]).

## 2022-03-27 DIAGNOSIS — Z0000 Encounter for general adult medical examination without abnormal findings: Secondary | ICD-10-CM | POA: Diagnosis not present

## 2022-03-27 DIAGNOSIS — I700 Atherosclerosis of aorta: Secondary | ICD-10-CM | POA: Diagnosis not present

## 2022-03-27 DIAGNOSIS — Z6825 Body mass index (BMI) 25.0-25.9, adult: Secondary | ICD-10-CM | POA: Diagnosis not present

## 2022-03-27 DIAGNOSIS — I10 Essential (primary) hypertension: Secondary | ICD-10-CM | POA: Diagnosis not present

## 2022-03-27 DIAGNOSIS — N401 Enlarged prostate with lower urinary tract symptoms: Secondary | ICD-10-CM | POA: Diagnosis not present

## 2022-03-27 DIAGNOSIS — G4700 Insomnia, unspecified: Secondary | ICD-10-CM | POA: Diagnosis not present

## 2022-06-26 DIAGNOSIS — I5032 Chronic diastolic (congestive) heart failure: Secondary | ICD-10-CM | POA: Diagnosis not present

## 2022-06-26 DIAGNOSIS — D075 Carcinoma in situ of prostate: Secondary | ICD-10-CM | POA: Diagnosis not present

## 2022-06-26 DIAGNOSIS — Z125 Encounter for screening for malignant neoplasm of prostate: Secondary | ICD-10-CM | POA: Diagnosis not present

## 2022-06-26 DIAGNOSIS — G4700 Insomnia, unspecified: Secondary | ICD-10-CM | POA: Diagnosis not present

## 2022-06-26 DIAGNOSIS — Z6824 Body mass index (BMI) 24.0-24.9, adult: Secondary | ICD-10-CM | POA: Diagnosis not present

## 2022-06-26 DIAGNOSIS — N401 Enlarged prostate with lower urinary tract symptoms: Secondary | ICD-10-CM | POA: Diagnosis not present

## 2022-06-26 DIAGNOSIS — I10 Essential (primary) hypertension: Secondary | ICD-10-CM | POA: Diagnosis not present

## 2022-06-26 DIAGNOSIS — I700 Atherosclerosis of aorta: Secondary | ICD-10-CM | POA: Diagnosis not present

## 2022-06-26 DIAGNOSIS — G629 Polyneuropathy, unspecified: Secondary | ICD-10-CM | POA: Diagnosis not present

## 2022-06-26 DIAGNOSIS — Z0000 Encounter for general adult medical examination without abnormal findings: Secondary | ICD-10-CM | POA: Diagnosis not present

## 2022-09-25 DIAGNOSIS — Z6824 Body mass index (BMI) 24.0-24.9, adult: Secondary | ICD-10-CM | POA: Diagnosis not present

## 2022-09-25 DIAGNOSIS — I700 Atherosclerosis of aorta: Secondary | ICD-10-CM | POA: Diagnosis not present

## 2022-09-25 DIAGNOSIS — D075 Carcinoma in situ of prostate: Secondary | ICD-10-CM | POA: Diagnosis not present

## 2022-09-25 DIAGNOSIS — N401 Enlarged prostate with lower urinary tract symptoms: Secondary | ICD-10-CM | POA: Diagnosis not present

## 2022-09-25 DIAGNOSIS — I5032 Chronic diastolic (congestive) heart failure: Secondary | ICD-10-CM | POA: Diagnosis not present

## 2022-09-25 DIAGNOSIS — G629 Polyneuropathy, unspecified: Secondary | ICD-10-CM | POA: Diagnosis not present

## 2022-09-25 DIAGNOSIS — N182 Chronic kidney disease, stage 2 (mild): Secondary | ICD-10-CM | POA: Diagnosis not present

## 2022-09-25 DIAGNOSIS — I10 Essential (primary) hypertension: Secondary | ICD-10-CM | POA: Diagnosis not present

## 2022-09-25 DIAGNOSIS — G4700 Insomnia, unspecified: Secondary | ICD-10-CM | POA: Diagnosis not present

## 2023-01-08 DIAGNOSIS — D075 Carcinoma in situ of prostate: Secondary | ICD-10-CM | POA: Diagnosis not present

## 2023-01-08 DIAGNOSIS — G4700 Insomnia, unspecified: Secondary | ICD-10-CM | POA: Diagnosis not present

## 2023-01-08 DIAGNOSIS — G629 Polyneuropathy, unspecified: Secondary | ICD-10-CM | POA: Diagnosis not present

## 2023-01-08 DIAGNOSIS — I10 Essential (primary) hypertension: Secondary | ICD-10-CM | POA: Diagnosis not present

## 2023-01-08 DIAGNOSIS — N401 Enlarged prostate with lower urinary tract symptoms: Secondary | ICD-10-CM | POA: Diagnosis not present

## 2023-01-08 DIAGNOSIS — N182 Chronic kidney disease, stage 2 (mild): Secondary | ICD-10-CM | POA: Diagnosis not present

## 2023-01-08 DIAGNOSIS — I700 Atherosclerosis of aorta: Secondary | ICD-10-CM | POA: Diagnosis not present

## 2023-01-08 DIAGNOSIS — I5032 Chronic diastolic (congestive) heart failure: Secondary | ICD-10-CM | POA: Diagnosis not present

## 2023-01-08 DIAGNOSIS — Z6825 Body mass index (BMI) 25.0-25.9, adult: Secondary | ICD-10-CM | POA: Diagnosis not present

## 2023-04-09 DIAGNOSIS — Z6825 Body mass index (BMI) 25.0-25.9, adult: Secondary | ICD-10-CM | POA: Diagnosis not present

## 2023-04-09 DIAGNOSIS — I10 Essential (primary) hypertension: Secondary | ICD-10-CM | POA: Diagnosis not present

## 2023-04-09 DIAGNOSIS — D075 Carcinoma in situ of prostate: Secondary | ICD-10-CM | POA: Diagnosis not present

## 2023-04-09 DIAGNOSIS — G4709 Other insomnia: Secondary | ICD-10-CM | POA: Diagnosis not present

## 2023-04-09 DIAGNOSIS — N401 Enlarged prostate with lower urinary tract symptoms: Secondary | ICD-10-CM | POA: Diagnosis not present

## 2023-04-09 DIAGNOSIS — N182 Chronic kidney disease, stage 2 (mild): Secondary | ICD-10-CM | POA: Diagnosis not present

## 2023-04-09 DIAGNOSIS — I5032 Chronic diastolic (congestive) heart failure: Secondary | ICD-10-CM | POA: Diagnosis not present

## 2023-04-09 DIAGNOSIS — G6289 Other specified polyneuropathies: Secondary | ICD-10-CM | POA: Diagnosis not present

## 2023-04-09 DIAGNOSIS — I700 Atherosclerosis of aorta: Secondary | ICD-10-CM | POA: Diagnosis not present

## 2023-07-17 DIAGNOSIS — G6289 Other specified polyneuropathies: Secondary | ICD-10-CM | POA: Diagnosis not present

## 2023-07-17 DIAGNOSIS — N401 Enlarged prostate with lower urinary tract symptoms: Secondary | ICD-10-CM | POA: Diagnosis not present

## 2023-07-17 DIAGNOSIS — I5032 Chronic diastolic (congestive) heart failure: Secondary | ICD-10-CM | POA: Diagnosis not present

## 2023-07-17 DIAGNOSIS — I700 Atherosclerosis of aorta: Secondary | ICD-10-CM | POA: Diagnosis not present

## 2023-07-17 DIAGNOSIS — I10 Essential (primary) hypertension: Secondary | ICD-10-CM | POA: Diagnosis not present

## 2023-07-17 DIAGNOSIS — D075 Carcinoma in situ of prostate: Secondary | ICD-10-CM | POA: Diagnosis not present

## 2023-07-17 DIAGNOSIS — G4709 Other insomnia: Secondary | ICD-10-CM | POA: Diagnosis not present

## 2023-07-17 DIAGNOSIS — Z6824 Body mass index (BMI) 24.0-24.9, adult: Secondary | ICD-10-CM | POA: Diagnosis not present

## 2023-07-17 DIAGNOSIS — N182 Chronic kidney disease, stage 2 (mild): Secondary | ICD-10-CM | POA: Diagnosis not present

## 2023-09-09 DIAGNOSIS — I5032 Chronic diastolic (congestive) heart failure: Secondary | ICD-10-CM | POA: Diagnosis not present

## 2023-09-09 DIAGNOSIS — I700 Atherosclerosis of aorta: Secondary | ICD-10-CM | POA: Diagnosis not present

## 2023-09-09 DIAGNOSIS — D075 Carcinoma in situ of prostate: Secondary | ICD-10-CM | POA: Diagnosis not present

## 2023-09-09 DIAGNOSIS — N182 Chronic kidney disease, stage 2 (mild): Secondary | ICD-10-CM | POA: Diagnosis not present

## 2023-09-09 DIAGNOSIS — G6289 Other specified polyneuropathies: Secondary | ICD-10-CM | POA: Diagnosis not present

## 2023-09-09 DIAGNOSIS — G4709 Other insomnia: Secondary | ICD-10-CM | POA: Diagnosis not present

## 2023-09-09 DIAGNOSIS — Z6824 Body mass index (BMI) 24.0-24.9, adult: Secondary | ICD-10-CM | POA: Diagnosis not present

## 2023-09-09 DIAGNOSIS — N401 Enlarged prostate with lower urinary tract symptoms: Secondary | ICD-10-CM | POA: Diagnosis not present

## 2023-09-09 DIAGNOSIS — I10 Essential (primary) hypertension: Secondary | ICD-10-CM | POA: Diagnosis not present

## 2023-10-27 DIAGNOSIS — N182 Chronic kidney disease, stage 2 (mild): Secondary | ICD-10-CM | POA: Diagnosis not present

## 2023-10-27 DIAGNOSIS — I10 Essential (primary) hypertension: Secondary | ICD-10-CM | POA: Diagnosis not present
